# Patient Record
Sex: Male | Born: 1945 | Race: White | Hispanic: No | Marital: Single | Smoking: Former smoker
Health system: Southern US, Community
[De-identification: ages and names within clinical notes are randomized; demographics above are authoritative.]

## PROBLEM LIST (undated history)

## (undated) DIAGNOSIS — M62541 Muscle wasting and atrophy, not elsewhere classified, right hand: Secondary | ICD-10-CM

## (undated) DIAGNOSIS — I491 Atrial premature depolarization: Secondary | ICD-10-CM

## (undated) DIAGNOSIS — I10 Essential (primary) hypertension: Secondary | ICD-10-CM

## (undated) DIAGNOSIS — K219 Gastro-esophageal reflux disease without esophagitis: Secondary | ICD-10-CM

## (undated) DIAGNOSIS — M62542 Muscle wasting and atrophy, not elsewhere classified, left hand: Secondary | ICD-10-CM

## (undated) DIAGNOSIS — F32A Depression, unspecified: Secondary | ICD-10-CM

## (undated) DIAGNOSIS — M19042 Primary osteoarthritis, left hand: Secondary | ICD-10-CM

## (undated) DIAGNOSIS — F329 Major depressive disorder, single episode, unspecified: Secondary | ICD-10-CM

## (undated) DIAGNOSIS — F419 Anxiety disorder, unspecified: Secondary | ICD-10-CM

## (undated) DIAGNOSIS — M19041 Primary osteoarthritis, right hand: Secondary | ICD-10-CM

## (undated) DIAGNOSIS — A770 Spotted fever due to Rickettsia rickettsii: Secondary | ICD-10-CM

## (undated) DIAGNOSIS — Z8719 Personal history of other diseases of the digestive system: Secondary | ICD-10-CM

## (undated) DIAGNOSIS — Z8709 Personal history of other diseases of the respiratory system: Secondary | ICD-10-CM

## (undated) DIAGNOSIS — B999 Unspecified infectious disease: Secondary | ICD-10-CM

## (undated) DIAGNOSIS — I471 Supraventricular tachycardia: Secondary | ICD-10-CM

## (undated) DIAGNOSIS — F1010 Alcohol abuse, uncomplicated: Secondary | ICD-10-CM

## (undated) DIAGNOSIS — M5030 Other cervical disc degeneration, unspecified cervical region: Secondary | ICD-10-CM

## (undated) DIAGNOSIS — E785 Hyperlipidemia, unspecified: Secondary | ICD-10-CM

## (undated) DIAGNOSIS — IMO0001 Reserved for inherently not codable concepts without codable children: Secondary | ICD-10-CM

## (undated) DIAGNOSIS — Z889 Allergy status to unspecified drugs, medicaments and biological substances status: Secondary | ICD-10-CM

## (undated) DIAGNOSIS — J45909 Unspecified asthma, uncomplicated: Secondary | ICD-10-CM

## (undated) DIAGNOSIS — M1990 Unspecified osteoarthritis, unspecified site: Secondary | ICD-10-CM

## (undated) DIAGNOSIS — N400 Enlarged prostate without lower urinary tract symptoms: Secondary | ICD-10-CM

## (undated) HISTORY — DX: Allergy status to unspecified drugs, medicaments and biological substances: Z88.9

## (undated) HISTORY — DX: Muscle wasting and atrophy, not elsewhere classified, right hand: M62.541

## (undated) HISTORY — DX: Alcohol abuse, uncomplicated: F10.10

## (undated) HISTORY — DX: Other cervical disc degeneration, unspecified cervical region: M50.30

## (undated) HISTORY — DX: Primary osteoarthritis, left hand: M19.042

## (undated) HISTORY — DX: Depression, unspecified: F32.A

## (undated) HISTORY — DX: Essential (primary) hypertension: I10

## (undated) HISTORY — DX: Major depressive disorder, single episode, unspecified: F32.9

## (undated) HISTORY — DX: Personal history of other diseases of the digestive system: Z87.19

## (undated) HISTORY — PX: OTHER SURGICAL HISTORY: SHX169

## (undated) HISTORY — DX: Gastro-esophageal reflux disease without esophagitis: K21.9

## (undated) HISTORY — DX: Supraventricular tachycardia: I47.1

## (undated) HISTORY — DX: Personal history of other diseases of the respiratory system: Z87.09

## (undated) HISTORY — DX: Atrial premature depolarization: I49.1

## (undated) HISTORY — DX: Spotted fever due to Rickettsia rickettsii: A77.0

## (undated) HISTORY — DX: Muscle wasting and atrophy, not elsewhere classified, left hand: M62.542

## (undated) HISTORY — DX: Unspecified osteoarthritis, unspecified site: M19.90

## (undated) HISTORY — DX: Hyperlipidemia, unspecified: E78.5

## (undated) HISTORY — DX: Unspecified infectious disease: B99.9

## (undated) HISTORY — DX: Primary osteoarthritis, right hand: M19.041

---

## 1997-09-18 ENCOUNTER — Emergency Department (HOSPITAL_COMMUNITY): Admission: EM | Admit: 1997-09-18 | Discharge: 1997-09-18 | Payer: Self-pay

## 2000-03-04 HISTORY — PX: UMBILICAL HERNIA REPAIR: SHX196

## 2000-11-25 ENCOUNTER — Ambulatory Visit (HOSPITAL_COMMUNITY): Admission: RE | Admit: 2000-11-25 | Discharge: 2000-11-25 | Payer: Self-pay

## 2010-03-28 ENCOUNTER — Ambulatory Visit (HOSPITAL_COMMUNITY)
Admission: RE | Admit: 2010-03-28 | Discharge: 2010-03-28 | Payer: Self-pay | Source: Home / Self Care | Attending: Chiropractic Medicine | Admitting: Chiropractic Medicine

## 2010-05-24 ENCOUNTER — Other Ambulatory Visit: Payer: Self-pay | Admitting: Allergy

## 2010-05-24 ENCOUNTER — Ambulatory Visit
Admission: RE | Admit: 2010-05-24 | Discharge: 2010-05-24 | Disposition: A | Discharge status: Home / Self Care | Payer: Federal, State, Local not specified - PPO | Source: Ambulatory Visit | Attending: Allergy | Admitting: Allergy

## 2010-05-24 DIAGNOSIS — R059 Cough, unspecified: Secondary | ICD-10-CM

## 2010-05-24 DIAGNOSIS — R05 Cough: Secondary | ICD-10-CM

## 2011-12-18 ENCOUNTER — Encounter: Payer: Self-pay | Admitting: Family Medicine

## 2012-03-23 ENCOUNTER — Ambulatory Visit: Payer: Federal, State, Local not specified - PPO | Admitting: Family Medicine

## 2012-04-27 ENCOUNTER — Ambulatory Visit (INDEPENDENT_AMBULATORY_CARE_PROVIDER_SITE_OTHER): Payer: Federal, State, Local not specified - PPO | Admitting: Family Medicine

## 2012-04-27 ENCOUNTER — Encounter: Payer: Self-pay | Admitting: Family Medicine

## 2012-04-27 VITALS — BP 140/100 | HR 87 | Temp 98.4°F | Wt 171.0 lb

## 2012-04-27 DIAGNOSIS — I10 Essential (primary) hypertension: Secondary | ICD-10-CM | POA: Insufficient documentation

## 2012-04-27 MED ORDER — HYDROCHLOROTHIAZIDE 12.5 MG PO CAPS: 12.5000 mg | ORAL_CAPSULE | Freq: Every day | ORAL | Status: DC

## 2012-04-27 NOTE — Progress Notes (Signed)
  Subjective:    Ernest Sanders is a 67 y.o. male who presents for evaluation of elevated blood pressures. Age at onset of elevated blood pressure:  66. Cardiac symptoms: none. Patient denies: chest pain, chest pressure/discomfort, claudication, dyspnea, exertional chest pressure/discomfort, fatigue, irregular heart beat, lower extremity edema, near-syncope, orthopnea, palpitations, paroxysmal nocturnal dyspnea, syncope and tachypnea. Cardiovascular risk factors: advanced age (older than 49 for men, 77 for women). Use of agents associated with hypertension: none. History of target organ damage: none.  The following portions of the patient's history were reviewed and updated as appropriate: allergies, current medications, past family history, past medical history, past social history, past surgical history and problem list.  Review of Systems Pertinent items are noted in HPI.   Objective:    BP 140/100  Pulse 87  Temp(Src) 98.4 F (36.9 C) (Oral)  Wt 171 lb (77.565 kg)  SpO2 95% General appearance: alert, cooperative, appears stated age and no distress Lungs: clear to auscultation bilaterally Heart: S1, S2 normal Extremities: extremities normal, atraumatic, no cyanosis or edema  Cardiographics ECG: nonspecific ST changes   Assessment:    Hypertension, stage 1 . Evidence of target organ damage: none.    Plan:    Medication: begin hct 12.5 mg daily--- pt had but never took. Dietary sodium restriction. Regular aerobic exercise. Check blood pressures 2-3 times weekly and record. Follow up: 3 weeks and as needed.

## 2012-04-27 NOTE — Assessment & Plan Note (Signed)
Pt had hctz 12.5 mg from previous dr but never took it He will start and rto 2-3 weeks Check echo secondary to abn ekg

## 2012-04-27 NOTE — Patient Instructions (Addendum)

## 2012-05-05 ENCOUNTER — Ambulatory Visit (HOSPITAL_COMMUNITY): Payer: Federal, State, Local not specified - PPO | Attending: Cardiovascular Disease | Admitting: Radiology

## 2012-05-05 DIAGNOSIS — R9431 Abnormal electrocardiogram [ECG] [EKG]: Secondary | ICD-10-CM

## 2012-05-05 DIAGNOSIS — I10 Essential (primary) hypertension: Secondary | ICD-10-CM | POA: Insufficient documentation

## 2012-05-05 NOTE — Progress Notes (Signed)
Echocardiogram performed.  

## 2012-05-12 ENCOUNTER — Telehealth: Payer: Self-pay | Admitting: *Deleted

## 2012-05-12 NOTE — Telephone Encounter (Signed)
Left msg on [facility] triage returning call to [facility] concerning echo test.../lmb

## 2012-05-13 NOTE — Telephone Encounter (Signed)
Mild diastolic dysfunction--- bp control is important----he was supposed to have a f/u for bp check   Discussed with patient and he voiced understanding, Stopped HCTZ because he feels it is making him sick, but his BP has been really good. It has been crazy so he has not had time to make it back in. Patient said he did see the Echo and it was sent to Dr.Sharma for a tighten in bronchial tubes- Apt scheduled for tomorrow KP

## 2012-05-14 ENCOUNTER — Ambulatory Visit (INDEPENDENT_AMBULATORY_CARE_PROVIDER_SITE_OTHER): Payer: Federal, State, Local not specified - PPO | Admitting: Family Medicine

## 2012-05-14 ENCOUNTER — Encounter: Payer: Self-pay | Admitting: Family Medicine

## 2012-05-14 VITALS — BP 150/92 | HR 68 | Temp 98.2°F | Wt 171.2 lb

## 2012-05-14 DIAGNOSIS — I10 Essential (primary) hypertension: Secondary | ICD-10-CM

## 2012-05-14 LAB — BASIC METABOLIC PANEL
Calcium: 9.2 mg/dL (ref 8.4–10.5)
GFR: 74.41 mL/min (ref >60.00)
Potassium: 4.2 mEq/L (ref 3.5–5.1)
Sodium: 138 mEq/L (ref 135–145)

## 2012-05-14 MED ORDER — HYDROCHLOROTHIAZIDE 12.5 MG PO TABS: ORAL_TABLET | ORAL | Status: DC

## 2012-05-14 NOTE — Patient Instructions (Signed)

## 2012-05-14 NOTE — Progress Notes (Signed)
  Subjective:    Patient here for follow-up of elevated blood pressure.  He is exercising and is adherent to a low-salt diet.  Blood pressure is not well controlled at home. Cardiac symptoms: none. Patient denies: chest pain. Cardiovascular risk factors: advanced age (older than 36 for men, 78 for women), hypertension and male gender. Use of agents associated with hypertension: none. History of target organ damage: none.  The following portions of the patient's history were reviewed and updated as appropriate: allergies, current medications, past family history, past medical history, past social history, past surgical history and problem list.  Review of Systems Pertinent items are noted in HPI.     Objective:    BP 150/92  Pulse 68  Temp(Src) 98.2 F (36.8 C) (Oral)  Wt 171 lb 3.2 oz (77.656 kg)  SpO2 95% General appearance: alert, cooperative, appears stated age and no distress Throat: lips, mucosa, and tongue normal; teeth and gums normal Neck: no adenopathy, no carotid bruit, no JVD, supple, symmetrical, trachea midline and thyroid not enlarged, symmetric, no tenderness/mass/nodules Lungs: clear to auscultation bilaterally Extremities: extremities normal, atraumatic, no cyanosis or edema    Assessment:    Hypertension, stage 1 pt stopped meds on his own. . Evidence of target organ damage: none.    Plan:    Medication: resume hctz 12.5   1/2 tab po qd. Dietary sodium restriction. Regular aerobic exercise. Check blood pressures 2-3 times weekly and record. Follow up: 2 weeks and as needed.

## 2012-10-07 ENCOUNTER — Other Ambulatory Visit: Payer: Self-pay

## 2013-01-07 ENCOUNTER — Other Ambulatory Visit: Payer: Self-pay

## 2013-05-05 DIAGNOSIS — M2010 Hallux valgus (acquired), unspecified foot: Secondary | ICD-10-CM

## 2014-08-29 ENCOUNTER — Other Ambulatory Visit: Payer: Self-pay

## 2015-09-22 ENCOUNTER — Ambulatory Visit (INDEPENDENT_AMBULATORY_CARE_PROVIDER_SITE_OTHER): Payer: Federal, State, Local not specified - PPO | Admitting: Physician Assistant

## 2015-09-22 VITALS — BP 162/100 | HR 81 | Temp 98.7°F | Resp 16 | Ht 70.5 in | Wt 166.2 lb

## 2015-09-22 DIAGNOSIS — K439 Ventral hernia without obstruction or gangrene: Secondary | ICD-10-CM | POA: Diagnosis not present

## 2015-09-22 NOTE — Progress Notes (Signed)
[facility] [address] [place] [address] [phone]  Date:  09/22/2015   Name:  Ernest Sanders   DOB:  [birth year]   MRN:  [id number]  PCP:  Ann Held, DO   Chief Complaint  Patient presents with  . Referral    to [facility] (Dr. [name]) for a hernia repair     History of Present Illness:  Ernest Sanders is a 70 y.o. male patient who presents to [facility] for hernia repair.   Patient reports that he had a right sided hernia that was 3 years ago.  He has never reported it to a provider, however states that he has nodules.   10/2012 thought he had a bulge at his left abdomen.  By12/2014 started wearing a belt, and has worn since.  1 year later he had a bulge at his right lower abdomen.  This radiates into his leg.   He has never had this followed. Umbilical hernia repair in 2002 likely with [facility] on [address]   2 weeks ago, as he was shoveling gravel, and had pain of the left side around belly button area.   No blood in stool or black stool.  No normal heavy lifting.  No extensive weight loss.  No blood or black stool that he can note to me.  Wt Readings from Last 3 Encounters:  09/22/15 166 lb 3.2 oz (75.388 kg)  05/14/12 171 lb 3.2 oz (77.656 kg)  04/27/12 171 lb (77.565 kg)    Patient Active Problem List   Diagnosis Date Noted  . HTN (hypertension) 04/27/2012    Past Medical History  Diagnosis Date  . Alcohol abuse     Sober since 01/08/71  . Arthritis   . Depression   . GERD (gastroesophageal reflux disease)   . Multiple allergies   . HTN (hypertension)     Past Surgical History  Procedure Laterality Date  . Umbilical hernia repair  2002    Social History  Substance Use Topics  . Smoking status: Former Smoker    Types: Pipe, Landscape architect  . Smokeless tobacco: Never Used  . Alcohol Use: No     Comment: Recovering    Family History  Problem Relation Age of Onset  . Drug abuse Father   .  Heart disease Father     CHF  . Hypertension Mother   . Diabetes Mother     Surgical  . Arthritis    . Stroke Paternal Grandmother     No Known Allergies  Medication list has been reviewed and updated.  Current Outpatient Prescriptions on File Prior to Visit  Medication Sig Dispense Refill  . aspirin 81 MG tablet Take 81 mg by mouth daily.    . B Complex Vitamins (B COMPLEX 100 PO) Take 1 tablet by mouth daily.    . cetirizine (ZYRTEC) 10 MG tablet Take 10 mg by mouth daily.    . diazepam (VALIUM) 5 MG tablet Take 2.5 mg by mouth 2 (two) times daily.    . Multiple Vitamin (MULTIVITAMIN) tablet Take 1 tablet by mouth daily.    . saw palmetto 500 MG capsule Take 500 mg by mouth daily.    . vitamin E 400 UNIT capsule Take 400 Units by mouth daily.     No current facility-administered medications on file prior to visit.    ROS ROS otherwise unremarkable unless listed above.   Physical Examination: BP 146/90 mmHg  Pulse 81  Temp(Src) 98.7 F (37.1 C) (Oral)  Resp 16  Ht 5' 10.5" (1.791 m)  Wt 166 lb 3.2 oz (75.388 kg)  BMI 23.50 kg/m2  SpO2 96% Ideal Body Weight: Weight in (lb) to have BMI = 25: 176.4  Physical Exam  Constitutional: He is oriented to person, place, and time. He appears well-developed and well-nourished. No distress.  HENT:  Head: Normocephalic and atraumatic.  Eyes: Conjunctivae and EOM are normal. Pupils are equal, round, and reactive to light.  Cardiovascular: Normal rate and regular rhythm.  Exam reveals no gallop and no friction rub.   No murmur heard. Pulses:      Carotid pulses are 2+ on the right side, and 2+ on the left side.      Radial pulses are 2+ on the right side, and 2+ on the left side.       Dorsalis pedis pulses are 2+ on the right side, and 2+ on the left side.  Pulmonary/Chest: Effort normal. No respiratory distress.  Abdominal:  Fatty like prominent nodule at the central abdomen.   There is a prominent reducible nodule along the  left lower quadrant of abdomen. Hernia can not be detected upon palpation of inguinal.  Neurological: He is alert and oriented to person, place, and time.  Skin: Skin is warm and dry. He is not diaphoretic.  Psychiatric: He has a normal mood and affect. His behavior is normal.    Assessment and Plan: Ernest Sanders is a 70 y.o. male who is here today for referral to surgery. --though inguinal hernia, could not be palpated-- is colored to be a hernia by hx --he also has the prominent central abdominal area that should be evaluated as well as the left abdomen, so I believe a consult by gen surgery would be appreciated at this time.   --I have advised that he have a colonoscopy soon--he request dna stool.  I will look into whether we can provide this request, or should this be given by his pcp.    Hernia of abdominal wall - Plan: Ambulatory referral to General Surgery   Ivar Drape, PA-C [facility] 09/22/2015 11:22 AM

## 2015-09-22 NOTE — Patient Instructions (Addendum)
     IF you received an x-ray today, you will receive an invoice from [facility]. Please contact [facility] at [phone] with questions or concerns regarding your invoice.   IF you received labwork today, you will receive an invoice from [employer]. Please contact [facility] at [phone] with questions or concerns regarding your invoice.   Our billing staff will not be able to assist you with questions regarding bills from these companies.  You will be contacted with the lab results as soon as they are available. The fastest way to get your results is to activate your My Chart account. Instructions are located on the last page of this paperwork. If you have not heard from [place] regarding the results in 2 weeks, please contact this office.    Please await contact for surgical consult.

## 2015-10-01 ENCOUNTER — Telehealth: Payer: Self-pay | Admitting: Physician Assistant

## 2015-10-01 NOTE — Telephone Encounter (Signed)
Hey, how can I obtain  The "new" dna stool for colon screening?

## 2015-10-04 NOTE — Telephone Encounter (Signed)
Ernest Sanders, I'm sorry but I do not know. I will route this to Lab since it would probably more likely involve them to see if they know the process.  Lab, please advise Ernest Sanders if you know the answer to her question below. Thanks!

## 2015-10-05 NOTE — Telephone Encounter (Signed)
Do the cologuard.

## 2015-10-09 ENCOUNTER — Other Ambulatory Visit: Payer: Self-pay | Admitting: Physician Assistant

## 2015-10-09 DIAGNOSIS — Z1211 Encounter for screening for malignant neoplasm of colon: Secondary | ICD-10-CM

## 2015-10-09 DIAGNOSIS — Z0000 Encounter for general adult medical examination without abnormal findings: Secondary | ICD-10-CM

## 2015-10-09 NOTE — Progress Notes (Signed)
Order faxed to cologuard.  °

## 2015-10-09 NOTE — Progress Notes (Signed)
I would like to order this cologuard for the patient.

## 2015-10-10 ENCOUNTER — Ambulatory Visit (INDEPENDENT_AMBULATORY_CARE_PROVIDER_SITE_OTHER): Payer: Federal, State, Local not specified - PPO

## 2015-10-10 ENCOUNTER — Ambulatory Visit (INDEPENDENT_AMBULATORY_CARE_PROVIDER_SITE_OTHER): Payer: Federal, State, Local not specified - PPO | Admitting: Family Medicine

## 2015-10-10 DIAGNOSIS — I10 Essential (primary) hypertension: Secondary | ICD-10-CM

## 2015-10-10 DIAGNOSIS — M549 Dorsalgia, unspecified: Secondary | ICD-10-CM

## 2015-10-10 DIAGNOSIS — S61402A Unspecified open wound of left hand, initial encounter: Secondary | ICD-10-CM | POA: Diagnosis not present

## 2015-10-10 DIAGNOSIS — S29012A Strain of muscle and tendon of back wall of thorax, initial encounter: Secondary | ICD-10-CM | POA: Diagnosis not present

## 2015-10-10 DIAGNOSIS — M546 Pain in thoracic spine: Secondary | ICD-10-CM

## 2015-10-10 DIAGNOSIS — M542 Cervicalgia: Secondary | ICD-10-CM | POA: Diagnosis not present

## 2015-10-10 DIAGNOSIS — M5030 Other cervical disc degeneration, unspecified cervical region: Secondary | ICD-10-CM | POA: Diagnosis not present

## 2015-10-10 MED ORDER — CYCLOBENZAPRINE HCL 5 MG PO TABS: ORAL_TABLET | ORAL | 0 refills | Status: DC
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
## 2015-10-13 ENCOUNTER — Ambulatory Visit: Payer: Self-pay | Admitting: Surgery

## 2015-10-17 NOTE — Telephone Encounter (Signed)
I have received the cologuard paperwork back saying the phone number and group number are missing.  Please fill -- I do not see a phone number for him, and do not know how to proceed.  Also do not see a current roi to look at to fill.  Thanks Placed paperwork in nurse box

## 2015-10-19 NOTE — Telephone Encounter (Signed)
I have the form for cologuard, but do not have the pt's ph # to complete. I called [name] on his emerg contact list and LM asking her to [facility] with info.

## 2015-10-21 ENCOUNTER — Encounter: Payer: Self-pay | Admitting: Physician Assistant

## 2015-10-21 DIAGNOSIS — K4020 Bilateral inguinal hernia, without obstruction or gangrene, not specified as recurrent: Secondary | ICD-10-CM | POA: Insufficient documentation

## 2015-10-23 ENCOUNTER — Telehealth: Payer: Self-pay | Admitting: *Deleted

## 2015-10-23 NOTE — Telephone Encounter (Signed)
Mychart message sent for phone number

## 2015-11-14 ENCOUNTER — Encounter (HOSPITAL_COMMUNITY)
Admission: RE | Admit: 2015-11-14 | Discharge: 2015-11-14 | Disposition: A | Discharge status: Home / Self Care | Payer: Federal, State, Local not specified - PPO | Source: Ambulatory Visit | Attending: Surgery | Admitting: Surgery

## 2015-11-14 ENCOUNTER — Encounter (HOSPITAL_COMMUNITY): Payer: Self-pay

## 2015-11-14 DIAGNOSIS — I10 Essential (primary) hypertension: Secondary | ICD-10-CM | POA: Insufficient documentation

## 2015-11-14 DIAGNOSIS — Z01812 Encounter for preprocedural laboratory examination: Secondary | ICD-10-CM | POA: Insufficient documentation

## 2015-11-14 DIAGNOSIS — Z01810 Encounter for preprocedural cardiovascular examination: Secondary | ICD-10-CM | POA: Diagnosis present

## 2015-11-14 HISTORY — DX: Unspecified asthma, uncomplicated: J45.909

## 2015-11-14 HISTORY — DX: Benign prostatic hyperplasia without lower urinary tract symptoms: N40.0

## 2015-11-14 HISTORY — DX: Anxiety disorder, unspecified: F41.9

## 2015-11-14 HISTORY — DX: Reserved for inherently not codable concepts without codable children: IMO0001

## 2015-11-14 LAB — COMPREHENSIVE METABOLIC PANEL
ALT: 21 U/L (ref 17–63)
AST: 31 U/L (ref 15–41)
Albumin: 3.9 g/dL (ref 3.5–5.0)
Alkaline Phosphatase: 42 U/L (ref 38–126)
Anion gap: 6 (ref 5–15)
BILIRUBIN TOTAL: 0.6 mg/dL (ref 0.3–1.2)
BUN: 12 mg/dL (ref 6–20)
CALCIUM: 9.2 mg/dL (ref 8.9–10.3)
CO2: 24 mmol/L (ref 22–32)
CREATININE: 0.97 mg/dL (ref 0.61–1.24)
Chloride: 110 mmol/L (ref 101–111)
Glucose, Bld: 131 mg/dL — ABNORMAL HIGH (ref 65–99)
Potassium: 4.1 mmol/L (ref 3.5–5.1)
Sodium: 140 mmol/L (ref 135–145)
TOTAL PROTEIN: 7.1 g/dL (ref 6.5–8.1)

## 2015-11-14 LAB — CBC
HCT: 47.1 % (ref 39.0–52.0)
Hemoglobin: 15.9 g/dL (ref 13.0–17.0)
MCH: 32.3 pg (ref 26.0–34.0)
MCHC: 33.8 g/dL (ref 30.0–36.0)
MCV: 95.7 fL (ref 78.0–100.0)
PLATELETS: 195 10*3/uL (ref 150–400)
RBC: 4.92 MIL/uL (ref 4.22–5.81)
RDW: 12.7 % (ref 11.5–15.5)
WBC: 5 10*3/uL (ref 4.0–10.5)

## 2015-11-14 NOTE — Pre-Procedure Instructions (Signed)
Beacher Giglia Munos  11/14/2015      [facility] [place], [place] - [address] [place] [address] Phone: [phone] Fax: [phone]    Your procedure is scheduled on Sept 19  Report to [place] at 800 A.M.  Call this number if you have problems the morning of surgery:  [phone]   Remember:  Do not eat food or drink liquids after midnight.  Take these medicines the morning of surgery with A SIP OF WATER Dymista  Stop taking Asprin, BC's, Goody's, Herbal medications, Fish Oil, Vitamins, Aleve, Ibuprofen, Advil, Motrin   Do not wear jewelry, make-up or nail polish.  Do not wear lotions, powders, or perfumes, or deoderant.  Do not shave 48 hours prior to surgery.  Men may shave face and neck.  Do not bring valuables to the hospital.  [facility] is not responsible for any belongings or valuables.  Contacts, dentures or bridgework may not be worn into surgery.  Leave your suitcase in the car.  After surgery it may be brought to your room.  For patients admitted to the hospital, discharge time will be determined by your treatment team.  Patients discharged the day of surgery will not be allowed to drive home.    Special instructions:  [place] - Preparing for Surgery  Before surgery, you can play an important role.  Because skin is not sterile, your skin needs to be as free of germs as possible.  You can reduce the number of germs on you skin by washing with CHG (chlorahexidine gluconate) soap before surgery.  CHG is an antiseptic cleaner which kills germs and bonds with the skin to continue killing germs even after washing.  Please DO NOT use if you have an allergy to CHG or antibacterial soaps.  If your skin becomes reddened/irritated stop using the CHG and inform your nurse when you arrive at Short Stay.  Do not shave (including legs and underarms) for at least 48 hours  prior to the first CHG shower.  You may shave your face.  Please follow these instructions carefully:   1.  Shower with CHG Soap the night before surgery and the  morning of Surgery.  2.  If you choose to wash your hair, wash your hair first as usual with your  normal shampoo.  3.  After you shampoo, rinse your hair and body thoroughly to remove the  Shampoo.  4.  Use CHG as you would any other liquid soap.  You can apply chg directly   to the skin and wash gently with scrungie or a clean washcloth.  5.  Apply the CHG Soap to your body ONLY FROM THE NECK DOWN.    Do not use on open wounds or open sores.  Avoid contact with your eyes,   ears, mouth and genitals (private parts).  Wash genitals (private parts)       with your normal soap.  6.  Wash thoroughly, paying special attention to the area where your surgery  will be performed.  7.  Thoroughly rinse your body with warm water from the neck down.  8.  DO NOT shower/wash with your normal soap after using and rinsing off   the CHG Soap.  9.  Pat yourself dry with a clean towel.            10.  Wear clean pajamas.  11.  Place clean sheets on your bed the night of your first shower and do not sleep with pets.  Day of Surgery  Do not apply any lotions/deoderants the morning of surgery.  Please wear clean clothes to the hospital/surgery center.     Please read over the following fact sheets that you were given. Pain Booklet and Surgical Site Infection Prevention

## 2015-11-14 NOTE — Progress Notes (Addendum)
PCP is Dr. Garnet Koyanagi- States he has not been in a while. States he saw a Film/video editor for the echo but does not remember who. Echo noted in from 05-05-12 Denies having any recent chest pain. He does voice concerns about his enlarged prostate and not being able to void after his surgery. Encouraged him to speak with Dr Georgette Dover and the Anesthesiologist on the day of surgery, voices understanding. He states he didn't have a problem before when he had surgery, but he had a cousin that did.

## 2015-11-21 ENCOUNTER — Encounter (HOSPITAL_COMMUNITY): Payer: Self-pay | Admitting: *Deleted

## 2015-11-21 ENCOUNTER — Ambulatory Visit (HOSPITAL_COMMUNITY)
Admission: RE | Admit: 2015-11-21 | Discharge: 2015-11-21 | Disposition: A | Discharge status: Home / Self Care | Payer: Federal, State, Local not specified - PPO | Source: Ambulatory Visit | Attending: Surgery | Admitting: Surgery

## 2015-11-21 ENCOUNTER — Ambulatory Visit (HOSPITAL_COMMUNITY): Payer: Federal, State, Local not specified - PPO | Admitting: Certified Registered Nurse Anesthetist

## 2015-11-21 ENCOUNTER — Encounter (HOSPITAL_COMMUNITY)
Admission: RE | Disposition: A | Discharge status: Home / Self Care | Payer: Self-pay | Source: Ambulatory Visit | Attending: Surgery

## 2015-11-21 DIAGNOSIS — K219 Gastro-esophageal reflux disease without esophagitis: Secondary | ICD-10-CM | POA: Insufficient documentation

## 2015-11-21 DIAGNOSIS — M1990 Unspecified osteoarthritis, unspecified site: Secondary | ICD-10-CM | POA: Diagnosis not present

## 2015-11-21 DIAGNOSIS — D171 Benign lipomatous neoplasm of skin and subcutaneous tissue of trunk: Secondary | ICD-10-CM | POA: Insufficient documentation

## 2015-11-21 DIAGNOSIS — K4020 Bilateral inguinal hernia, without obstruction or gangrene, not specified as recurrent: Secondary | ICD-10-CM | POA: Diagnosis present

## 2015-11-21 DIAGNOSIS — F1010 Alcohol abuse, uncomplicated: Secondary | ICD-10-CM | POA: Diagnosis not present

## 2015-11-21 DIAGNOSIS — Z87891 Personal history of nicotine dependence: Secondary | ICD-10-CM | POA: Diagnosis not present

## 2015-11-21 HISTORY — PX: INGUINAL HERNIA REPAIR: SHX194

## 2015-11-21 HISTORY — PX: LIPOMA EXCISION: SHX5283

## 2015-11-21 HISTORY — PX: INSERTION OF MESH: SHX5868

## 2015-11-21 SURGERY — REPAIR, HERNIA, INGUINAL, BILATERAL, ADULT
Anesthesia: General | Site: Groin

## 2015-11-21 MED ORDER — 0.9 % SODIUM CHLORIDE (POUR BTL) OPTIME
TOPICAL | Status: DC | PRN
  Administered 2015-11-21: 1000 mL

## 2015-11-21 MED ORDER — ONDANSETRON HCL 4 MG/2ML IJ SOLN
INTRAMUSCULAR | Status: DC | PRN
  Administered 2015-11-21: 4 mg via INTRAVENOUS

## 2015-11-21 MED ORDER — HYDROCODONE-ACETAMINOPHEN 5-325 MG PO TABS
1.0000 | ORAL_TABLET | Freq: Four times a day (QID) | ORAL | 0 refills | Status: DC | PRN
Start: 2015-11-21 — End: 2016-12-12

## 2015-11-21 MED ORDER — ROCURONIUM BROMIDE 100 MG/10ML IV SOLN
INTRAVENOUS | Status: DC | PRN
  Administered 2015-11-21: 50 mg via INTRAVENOUS
  Administered 2015-11-21: 20 mg via INTRAVENOUS

## 2015-11-21 MED ORDER — ONDANSETRON HCL 4 MG/2ML IJ SOLN
INTRAMUSCULAR | Status: AC
  Filled 2015-11-21: qty 2

## 2015-11-21 MED ORDER — MIDAZOLAM HCL 2 MG/2ML IJ SOLN
2.0000 mg | Freq: Once | INTRAMUSCULAR | Status: AC
  Administered 2015-11-21: 2 mg via INTRAVENOUS

## 2015-11-21 MED ORDER — SUGAMMADEX SODIUM 200 MG/2ML IV SOLN
INTRAVENOUS | Status: DC | PRN
  Administered 2015-11-21: 150 mg via INTRAVENOUS

## 2015-11-21 MED ORDER — EPHEDRINE SULFATE 50 MG/ML IJ SOLN
INTRAMUSCULAR | Status: DC | PRN
  Administered 2015-11-21: 10 mg via INTRAVENOUS

## 2015-11-21 MED ORDER — LACTATED RINGERS IV SOLN
INTRAVENOUS | Status: DC
  Administered 2015-11-21 (×2): via INTRAVENOUS

## 2015-11-21 MED ORDER — MIDAZOLAM HCL 2 MG/2ML IJ SOLN: 0.5000 mg | Freq: Once | INTRAMUSCULAR | Status: DC | PRN

## 2015-11-21 MED ORDER — LIDOCAINE 2% (20 MG/ML) 5 ML SYRINGE
INTRAMUSCULAR | Status: AC
  Filled 2015-11-21: qty 5

## 2015-11-21 MED ORDER — PHENYLEPHRINE HCL 10 MG/ML IJ SOLN
INTRAVENOUS | Status: DC | PRN
  Administered 2015-11-21: 40 ug/min via INTRAVENOUS

## 2015-11-21 MED ORDER — FENTANYL CITRATE (PF) 100 MCG/2ML IJ SOLN
INTRAMUSCULAR | Status: AC
  Administered 2015-11-21: 100 ug via INTRAVENOUS
  Filled 2015-11-21: qty 2

## 2015-11-21 MED ORDER — PHENYLEPHRINE HCL 10 MG/ML IJ SOLN
INTRAMUSCULAR | Status: DC | PRN
  Administered 2015-11-21 (×2): 80 ug via INTRAVENOUS
  Administered 2015-11-21: 120 ug via INTRAVENOUS
  Administered 2015-11-21: 40 ug via INTRAVENOUS
  Administered 2015-11-21 (×3): 120 ug via INTRAVENOUS

## 2015-11-21 MED ORDER — PROPOFOL 10 MG/ML IV BOLUS
INTRAVENOUS | Status: AC
  Filled 2015-11-21: qty 20

## 2015-11-21 MED ORDER — PROPOFOL 10 MG/ML IV BOLUS
INTRAVENOUS | Status: DC | PRN
  Administered 2015-11-21: 150 mg via INTRAVENOUS

## 2015-11-21 MED ORDER — FENTANYL CITRATE (PF) 100 MCG/2ML IJ SOLN
INTRAMUSCULAR | Status: AC
  Filled 2015-11-21: qty 2

## 2015-11-21 MED ORDER — BUPIVACAINE-EPINEPHRINE (PF) 0.25% -1:200000 IJ SOLN
INTRAMUSCULAR | Status: AC
  Filled 2015-11-21: qty 60

## 2015-11-21 MED ORDER — PROMETHAZINE HCL 25 MG/ML IJ SOLN: 6.2500 mg | INTRAMUSCULAR | Status: DC | PRN

## 2015-11-21 MED ORDER — CHLORHEXIDINE GLUCONATE CLOTH 2 % EX PADS: 6.0000 | MEDICATED_PAD | Freq: Once | CUTANEOUS | Status: DC

## 2015-11-21 MED ORDER — FENTANYL CITRATE (PF) 100 MCG/2ML IJ SOLN
100.0000 ug | Freq: Once | INTRAMUSCULAR | Status: AC
  Administered 2015-11-21: 100 ug via INTRAVENOUS

## 2015-11-21 MED ORDER — BUPIVACAINE-EPINEPHRINE 0.25% -1:200000 IJ SOLN
INTRAMUSCULAR | Status: DC | PRN
  Administered 2015-11-21: 30 mL

## 2015-11-21 MED ORDER — CEFAZOLIN SODIUM-DEXTROSE 2-4 GM/100ML-% IV SOLN
2.0000 g | INTRAVENOUS | Status: AC
  Administered 2015-11-21: 2 g via INTRAVENOUS

## 2015-11-21 MED ORDER — FENTANYL CITRATE (PF) 100 MCG/2ML IJ SOLN
INTRAMUSCULAR | Status: DC | PRN
  Administered 2015-11-21: 100 ug via INTRAVENOUS

## 2015-11-21 MED ORDER — MIDAZOLAM HCL 2 MG/2ML IJ SOLN
INTRAMUSCULAR | Status: AC
  Administered 2015-11-21: 2 mg via INTRAVENOUS
  Filled 2015-11-21: qty 2

## 2015-11-21 MED ORDER — FENTANYL CITRATE (PF) 100 MCG/2ML IJ SOLN: 25.0000 ug | INTRAMUSCULAR | Status: DC | PRN

## 2015-11-21 MED ORDER — MEPERIDINE HCL 25 MG/ML IJ SOLN: 6.2500 mg | INTRAMUSCULAR | Status: DC | PRN

## 2015-11-21 MED ORDER — CEFAZOLIN SODIUM-DEXTROSE 2-4 GM/100ML-% IV SOLN
INTRAVENOUS | Status: AC
  Filled 2015-11-21: qty 100

## 2015-11-21 MED ORDER — BUPIVACAINE-EPINEPHRINE (PF) 0.25% -1:200000 IJ SOLN
INTRAMUSCULAR | Status: DC | PRN
  Administered 2015-11-21 (×2): 30 mL

## 2015-11-21 MED ORDER — SUGAMMADEX SODIUM 200 MG/2ML IV SOLN
INTRAVENOUS | Status: AC
  Filled 2015-11-21: qty 2

## 2015-11-21 MED ORDER — LIDOCAINE HCL (CARDIAC) 20 MG/ML IV SOLN
INTRAVENOUS | Status: DC | PRN
  Administered 2015-11-21: 20 mg via INTRAVENOUS

## 2015-11-21 SURGICAL SUPPLY — 60 items
APL SKNCLS STERI-STRIP NONHPOA (GAUZE/BANDAGES/DRESSINGS) ×2
BENZOIN TINCTURE PRP APPL 2/3 (GAUZE/BANDAGES/DRESSINGS) ×5 IMPLANT
BLADE SURG 10 STRL SS (BLADE) ×3 IMPLANT
BLADE SURG 15 STRL LF DISP TIS (BLADE) ×2 IMPLANT
BLADE SURG 15 STRL SS (BLADE) ×3
BLADE SURG ROTATE 9660 (MISCELLANEOUS) IMPLANT
CHLORAPREP W/TINT 26ML (MISCELLANEOUS) ×3 IMPLANT
COVER SURGICAL LIGHT HANDLE (MISCELLANEOUS) ×3 IMPLANT
DECANTER SPIKE VIAL GLASS SM (MISCELLANEOUS) ×3 IMPLANT
DRAIN PENROSE 1/2X12 LTX STRL (WOUND CARE) IMPLANT
DRAPE LAPAROSCOPIC ABDOMINAL (DRAPES) IMPLANT
DRAPE LAPAROTOMY T 98X78 PEDS (DRAPES) ×3 IMPLANT
DRAPE LAPAROTOMY TRNSV 102X78 (DRAPE) IMPLANT
DRAPE UTILITY XL STRL (DRAPES) ×6 IMPLANT
DRSG TEGADERM 2-3/8X2-3/4 SM (GAUZE/BANDAGES/DRESSINGS) ×1 IMPLANT
DRSG TEGADERM 4X4.75 (GAUZE/BANDAGES/DRESSINGS) ×5 IMPLANT
ELECT CAUTERY BLADE 6.4 (BLADE) ×3 IMPLANT
ELECT REM PT RETURN 9FT ADLT (ELECTROSURGICAL) ×3
ELECTRODE REM PT RTRN 9FT ADLT (ELECTROSURGICAL) ×2 IMPLANT
GAUZE SPONGE 2X2 8PLY STRL LF (GAUZE/BANDAGES/DRESSINGS) IMPLANT
GAUZE SPONGE 4X4 12PLY STRL (GAUZE/BANDAGES/DRESSINGS) ×3 IMPLANT
GAUZE SPONGE 4X4 16PLY XRAY LF (GAUZE/BANDAGES/DRESSINGS) ×3 IMPLANT
GLOVE BIO SURGEON STRL SZ7 (GLOVE) ×4 IMPLANT
GLOVE BIOGEL PI IND STRL 7.0 (GLOVE) IMPLANT
GLOVE BIOGEL PI IND STRL 7.5 (GLOVE) ×2 IMPLANT
GLOVE BIOGEL PI INDICATOR 7.0 (GLOVE) ×1
GLOVE BIOGEL PI INDICATOR 7.5 (GLOVE) ×1
GOWN STRL REUS W/ TWL LRG LVL3 (GOWN DISPOSABLE) ×4 IMPLANT
GOWN STRL REUS W/TWL LRG LVL3 (GOWN DISPOSABLE) ×6
KIT BASIN OR (CUSTOM PROCEDURE TRAY) ×3 IMPLANT
KIT ROOM TURNOVER OR (KITS) ×3 IMPLANT
MESH PARIETEX PROGRIP LEFT (Mesh General) ×1 IMPLANT
MESH PARIETEX PROGRIP RIGHT (Mesh General) ×1 IMPLANT
NDL HYPO 25GX1X1/2 BEV (NEEDLE) ×2 IMPLANT
NEEDLE HYPO 25GX1X1/2 BEV (NEEDLE) ×3 IMPLANT
NS IRRIG 1000ML POUR BTL (IV SOLUTION) ×3 IMPLANT
PACK SURGICAL SETUP 50X90 (CUSTOM PROCEDURE TRAY) ×3 IMPLANT
PAD ARMBOARD 7.5X6 YLW CONV (MISCELLANEOUS) ×3 IMPLANT
PENCIL BUTTON HOLSTER BLD 10FT (ELECTRODE) ×3 IMPLANT
SPECIMEN JAR SMALL (MISCELLANEOUS) ×3 IMPLANT
SPONGE GAUZE 2X2 STER 10/PKG (GAUZE/BANDAGES/DRESSINGS) ×1
SPONGE GAUZE 4X4 12PLY STER LF (GAUZE/BANDAGES/DRESSINGS) ×1 IMPLANT
SPONGE INTESTINAL PEANUT (DISPOSABLE) ×3 IMPLANT
SPONGE LAP 18X18 X RAY DECT (DISPOSABLE) ×3 IMPLANT
STRIP CLOSURE SKIN 1/2X4 (GAUZE/BANDAGES/DRESSINGS) ×5 IMPLANT
SUT MNCRL AB 4-0 PS2 18 (SUTURE) ×7 IMPLANT
SUT SILK 2 0 SH (SUTURE) IMPLANT
SUT SILK 3 0 (SUTURE)
SUT SILK 3-0 18XBRD TIE 12 (SUTURE) IMPLANT
SUT VIC AB 0 CT2 27 (SUTURE) ×6 IMPLANT
SUT VIC AB 2-0 SH 27 (SUTURE) ×6
SUT VIC AB 2-0 SH 27X BRD (SUTURE) IMPLANT
SUT VIC AB 2-0 SH 27XBRD (SUTURE) ×4 IMPLANT
SUT VIC AB 3-0 SH 27 (SUTURE) ×12
SUT VIC AB 3-0 SH 27X BRD (SUTURE) ×4 IMPLANT
SUT VIC AB 3-0 SH 27XBRD (SUTURE) ×2 IMPLANT
SYR BULB 3OZ (MISCELLANEOUS) ×3 IMPLANT
SYR CONTROL 10ML LL (SYRINGE) ×3 IMPLANT
TOWEL OR 17X24 6PK STRL BLUE (TOWEL DISPOSABLE) ×3 IMPLANT
TOWEL OR 17X26 10 PK STRL BLUE (TOWEL DISPOSABLE) ×3 IMPLANT

## 2015-11-21 NOTE — Anesthesia Procedure Notes (Signed)
Anesthesia Regional Block:  TAP block  Pre-Anesthetic Checklist: ,, timeout performed, Correct Patient, Correct Site, Correct Laterality, Correct Procedure, Correct Position, site marked, Risks and benefits discussed,  Surgical consent,  Pre-op evaluation,  At surgeon's request and post-op pain management  Laterality: Left  Prep: chloraprep       Needles:  Injection technique: Single-shot  Needle Type: Echogenic Needle     Needle Length: 9cm 9 cm Needle Gauge: 22 and 22 G    Additional Needles:  Procedures: ultrasound guided (picture in chart) TAP block Narrative:  Start time: 11/21/2015 10:43 AM End time: 11/21/2015 10:49 AM Injection made incrementally with aspirations every 5 mL.  Performed by: Personally  Anesthesiologist: Glennon Mac, Arin Peral  Additional Notes: Pt identified in Holding room.  Monitors applied. Working IV access confirmed. Sterile prep, drape L flank.  #22ga ECHOgenic needle into TAP with US guidance.  30cc 0.25% Bupivacaine with 1:200k epi injected incrementally after negative test dose, good layering of local.  Patient asymptomatic, VSS, no heme aspirated, tolerated well.

## 2015-11-21 NOTE — Op Note (Signed)
Open repair of bilateral inguinal hernias with mesh/ excision of multiple subcutaneous abdominal wall lipomas  Indications: The patient presented with a history of a bilateral, reducible inguinal hernia.  He also has several enlarging palpable subcutaneous masses on his abdominal wall that he needs to have excised.  Pre-operative Diagnosis: bilateral reducible inguinal hernia; multiple subcutaneous abdominal wall lipomas Post-operative Diagnosis: same  Surgeon: Maia Petties.   Assistants: none  Anesthesia: General endotracheal anesthesia and TAP block  ASA Class: 2  Procedure Details  The patient was seen again in the Holding Room. The risks, benefits, complications, treatment options, and expected outcomes were discussed with the patient. The possibilities of reaction to medication, pulmonary aspiration, perforation of viscus, bleeding, recurrent infection, the need for additional procedures, and development of a complication requiring transfusion or further operation were discussed with the patient and/or family. The likelihood of success in repairing the hernia and returning the patient to their previous functional status is good.  There was concurrence with the proposed plan, and informed consent was obtained. The site of surgery was properly noted/marked. The patient was taken to the Operating Room, identified as Ernest Sanders, and the procedure verified as bilateral inguinal hernia repair and lipomas excision. A Time Out was held and the above information confirmed.  The patient was placed in the supine position and underwent induction of anesthesia. The abdomen and groin were prepped with Chloraprep and draped in the standard fashion, and 0.25% Marcaine with epinephrine was used to anesthetize the skin over the mid-portion of the left inguinal canal. An oblique incision was made. Dissection was carried down through the subcutaneous tissue with cautery to the external oblique fascia.   We opened the external oblique fascia along the direction of its fibers to the external ring.  The spermatic cord was circumferentially dissected bluntly and retracted with a Penrose drain.    The floor of the inguinal canal was inspected and was intact.  We skeletonized the spermatic cord and reduced a very large left indirect inguinal hernia.  The internal ring was tightened with 0 Vicryl.  We used a left-sided Progrip mesh which was inserted and deployed across the floor of the inguinal canal. The mesh was tucked underneath the external oblique fascia laterally.  The flap of the mesh was closed around the spermatic cord to recreate the internal inguinal ring.  The mesh was secured to the pubic tubercle with 0 Vicryl.  The external oblique fascia was reapproximated with 2-0 Vicryl.  3-0 Vicryl was used to close the subcutaneous tissues and 4-0 Monocryl was used to close the skin in subcuticular fashion.   0.25% Marcaine with epinephrine was used to anesthetize the skin over the mid-portion of the right inguinal canal. An oblique incision was made. Dissection was carried down through the subcutaneous tissue with cautery to the external oblique fascia.  We opened the external oblique fascia along the direction of its fibers to the external ring.  The spermatic cord was circumferentially dissected bluntly and retracted with a Penrose drain.  The floor of the inguinal canal was inspected was intact but lax.  We skeletonized the spermatic cord and reduced a small indirect hernia .  We used a right-sided Progrip mesh which was inserted and deployed across the floor of the inguinal canal. The mesh was tucked underneath the external oblique fascia laterally.  The flap of the mesh was closed around the spermatic cord to recreate the internal inguinal ring.  The mesh was secured to the pubic  tubercle with 0 Vicryl.  The external oblique fascia was reapproximated with 2-0 Vicryl.  3-0 Vicryl was used to close the  subcutaneous tissues and 4-0 Monocryl was used to close the skin in subcuticular fashion.  We then made small incision directly over the three palpable subcutaneous masses.  One was just below the right costal margin in the mid-clavicular line.  The second mass was in the epigastrium.  The third mass was in the left anterior axillary line at the level of the umbilicus.  We bluntly dissected around each lipoma and removed these completely.  There were two lipomas in the epigastrium that were directly adjacent to each other.  Right costal margin - 3 cm; epigastric - 2 cm/ 2 cm; left side - 2 cm.  The incisions were closed with 4-0 Monocryl.  Benzoin and steri-strips were used to seal all of the incisions.  A clean dressing was applied.  The patient was then extubated and brought to the recovery room in stable condition.  All sponge, instrument, and needle counts were correct prior to closure and at the conclusion of the case.   Estimated Blood Loss: Minimal                 Complications: None; patient tolerated the procedure well.         Disposition: PACU - hemodynamically stable.         Condition: stable  Imogene Burn. Georgette Dover, MD, [facility]  General/ Trauma Surgery  11/21/2015 1:02 PM

## 2015-11-21 NOTE — Anesthesia Procedure Notes (Signed)
Anesthesia Regional Block:  TAP block  Pre-Anesthetic Checklist: ,, timeout performed, Correct Patient, Correct Site, Correct Laterality, Correct Procedure, Correct Position, site marked, Risks and benefits discussed, Surgical consent,  Pre-op evaluation,  At surgeon's request  Laterality: Right  Prep: chloraprep       Needles:   Needle Type: Echogenic Needle     Needle Length: 9cm 9 cm Needle Gauge: 22 and 22 G    Additional Needles:  Procedures: ultrasound guided (picture in chart) TAP block Narrative:  Start time: 11/21/2015 10:35 AM End time: 11/21/2015 10:41 AM Injection made incrementally with aspirations every 5 mL.  Performed by: Personally  Anesthesiologist: Glennon Mac, Meesha Sek  Additional Notes: Pt identified in Holding room.  Monitors applied. Working IV access confirmed. Sterile prep, drape R flank.  #22ga ECHOgenic needle into TAP with US guidance.  30cc 0.25% Bupivacaine with 1:200k epi injected incrementally after negative test dose, good layering of local.  Patient asymptomatic, VSS, no heme aspirated, tolerated well.

## 2015-11-21 NOTE — Anesthesia Preprocedure Evaluation (Addendum)
Anesthesia Evaluation  Patient identified by MRN, date of birth, ID band Patient awake    Reviewed: Allergy & Precautions, NPO status , Patient's Chart, lab work & pertinent test results  History of Anesthesia Complications Negative for: history of anesthetic complications  Airway Mallampati: I  TM Distance: >3 FB Neck ROM: Full    Dental  (+) Dental Advisory Given, Caps   Pulmonary former smoker,    breath sounds clear to auscultation       Cardiovascular hypertension (no meds ),  Rhythm:Regular Rate:Normal  '14 ECHO: EF 50-55%, mild MR   Neuro/Psych Anxiety Depression negative neurological ROS     GI/Hepatic GERD  Controlled,Remote h/o ETOH abuse   Endo/Other  negative endocrine ROS  Renal/GU negative Renal ROS     Musculoskeletal  (+) Arthritis ,   Abdominal   Peds  Hematology negative hematology ROS (+)   Anesthesia Other Findings   Reproductive/Obstetrics                            Anesthesia Physical Anesthesia Plan  ASA: II  Anesthesia Plan: General   Post-op Pain Management: GA combined w/ Regional for post-op pain   Induction: Intravenous  Airway Management Planned: Oral ETT  Additional Equipment:   Intra-op Plan:   Post-operative Plan: Extubation in OR  Informed Consent: I have reviewed the patients History and Physical, chart, labs and discussed the procedure including the risks, benefits and alternatives for the proposed anesthesia with the patient or authorized representative who has indicated his/her understanding and acceptance.   Dental advisory given  Plan Discussed with: CRNA and Surgeon  Anesthesia Plan Comments: (Plan routine monitors, GETA with bilateral TAP blocks for post op analgesia)        Anesthesia Quick Evaluation

## 2015-11-21 NOTE — H&P (Signed)
History of Present Illness Imogene Burn. Stokes Rattigan MD; 10/13/2015 11:27 AM) Patient words: hernia.  The patient is a 70 year old male who presents with an inguinal hernia. Referred by Dr. Garnet Koyanagi and Ivar Drape PA-C for bilateral inguinal hernias  This is a 70 year old male who presents with several year history of bilateral inguinal hernias. These have become fairly large and the patient has been wearing a truss for the last year. He has a lot of problems with seasonal allergies and often has very vigorous sneezes. This has been a problem since childhood. He had an umbilical hernia repaired 15 years ago reportedly with some mesh. We do not have those records available. He has some occasional twinges of pain just below his umbilicus but does not see any bulging in that area. The patient denies any GI obstructive symptoms. Incidentally, the patient also has had multiple lipomas removed from various parts of his body and still has several large lipomas. No imaging has been performed.    Other Problems  Alcohol Abuse Anxiety Disorder Arthritis Asthma Back Pain Depression Enlarged Prostate Gastroesophageal Reflux Disease Hemorrhoids High blood pressure Inguinal Hernia Umbilical Hernia Repair  Diagnostic Studies History  Colonoscopy never  Allergies No Known Drug Allergies  Medication History DiazePAM (5MG  Tablet, Oral) Active. ZyrTEC (10MG  Tablet, Oral) Active. Multiple Vitamin (Oral) Active. Fluticasone Propionate (50MCG/ACT Suspension, Nasal) Active. Medications Reconciled  Social History  Alcohol use Remotely quit alcohol use. Caffeine use Coffee, Tea. No drug use Tobacco use Former smoker.  Family History  Cancer Mother. Heart Disease Father. Hypertension Mother. Respiratory Condition Father.    Review of Systems General Not Present- Appetite Loss, Chills, Fatigue, Fever, Night Sweats, Weight Gain and Weight Loss. Skin Not  Present- Change in Wart/Mole, Dryness, Hives, Jaundice, New Lesions, Non-Healing Wounds, Rash and Ulcer. HEENT Present- Ringing in the Ears, Seasonal Allergies and Wears glasses/contact lenses. Not Present- Earache, Hearing Loss, Hoarseness, Nose Bleed, Oral Ulcers, Sinus Pain, Sore Throat, Visual Disturbances and Yellow Eyes. Respiratory Present- Wheezing. Not Present- Bloody sputum, Chronic Cough, Difficulty Breathing and Snoring. Breast Not Present- Breast Mass, Breast Pain, Nipple Discharge and Skin Changes. Cardiovascular Present- Leg Cramps. Not Present- Chest Pain, Difficulty Breathing Lying Down, Palpitations, Rapid Heart Rate, Shortness of Breath and Swelling of Extremities. Gastrointestinal Present- Bloating, Change in Bowel Habits, Excessive gas, Hemorrhoids, Indigestion and Nausea. Not Present- Abdominal Pain, Bloody Stool, Chronic diarrhea, Constipation, Difficulty Swallowing, Gets full quickly at meals, Rectal Pain and Vomiting. Male Genitourinary Present- Frequency, Nocturia and Urgency. Not Present- Blood in Urine, Change in Urinary Stream, Impotence, Painful Urination and Urine Leakage. Musculoskeletal Present- Back Pain, Joint Pain, Joint Stiffness, Muscle Pain and Swelling of Extremities. Not Present- Muscle Weakness. Neurological Not Present- Decreased Memory, Fainting, Headaches, Numbness, Seizures, Tingling, Tremor, Trouble walking and Weakness. Psychiatric Present- Anxiety, Change in Sleep Pattern and Depression. Not Present- Bipolar, Fearful and Frequent crying. Endocrine Not Present- Cold Intolerance, Excessive Hunger, Hair Changes, Heat Intolerance, Hot flashes and New Diabetes. Hematology Not Present- Blood Thinners, Easy Bruising, Excessive bleeding, Gland problems, HIV and Persistent Infections.  Vitals  Weight: 162.8 lb Height: 70.5in Body Surface Area: 1.92 m Body Mass Index: 23.03 kg/m  Temp.: 31F(Oral)  Pulse: 72 (Regular)  BP: 132/70 (Sitting, Left  Arm, Standard)       Physical Exam  The physical exam findings are as follows: Note:WDWN in NAD HEENT: EOMI, sclera anicteric Neck: No masses, no thyromegaly Lungs: CTA bilaterally; normal respiratory effort CV: Regular rate and rhythm; no murmurs Abd: +bowel  sounds, soft, non-tender, visible 4 cm subcutaneous mass in epigastrium; another 2 cm subcutaneous mass on right side; minimally tender Well-healed umbilical incision - no sign of umbilical hernia GU: bilateral descended testes; no testicular masses; large left inguinal hernia extending down into scrotum; visible right inguinal hernia Both hernias are reducible when supine Ext: Well-perfused; no edema Skin: Warm, dry; no sign of jaundice    Assessment & Plan BILATERAL INGUINAL HERNIA WITHOUT OBSTRUCTION OR GANGRENE (K40.20) 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 Schedule for Surgery - Open bilateral inguinal hernia repairs with mesh/ excision of two abdominal wall lipomas. The surgical procedure has been discussed with the patient. Potential risks, benefits, alternative treatments, and expected outcomes have been explained. All of the patient's questions at this time have been answered. The likelihood of reaching the patient's treatment goal is good. The patient understand the proposed surgical procedure and wishes to proceed.    Imogene Burn. Georgette Dover, MD, [facility]  General/ Trauma Surgery  11/21/2015 10:32 AM

## 2015-11-21 NOTE — Transfer of Care (Signed)
Immediate Anesthesia Transfer of Care Note  Patient: Ernest Sanders  Procedure(s) Performed: Procedure(s): OPEN BILATERAL INGUINAL HERNIA REPAIR (Bilateral) INSERTION OF MESH (Bilateral) EXCISION TWO ABDOMINAL WALL LIPOMAS (N/A)  Patient Location: PACU  Anesthesia Type:GA combined with regional for post-op pain  Level of Consciousness: awake, alert , oriented and patient cooperative  Airway & Oxygen Therapy: Patient Spontanous Breathing and Patient connected to nasal cannula oxygen  Post-op Assessment: Report given to RN and Post -op Vital signs reviewed and stable  Post vital signs: Reviewed and stable  Last Vitals:  Vitals:   11/21/15 1017 11/21/15 1304  BP:  (!) 127/95  Pulse: 81 93  Resp: 13 14  Temp:  36.8 C    Last Pain:  Vitals:   11/21/15 0913  TempSrc: Oral         Complications: No apparent anesthesia complications

## 2015-11-21 NOTE — Anesthesia Postprocedure Evaluation (Signed)
Anesthesia Post Note  Patient: Timofei Embery Bello  Procedure(s) Performed: Procedure(s) (LRB): OPEN BILATERAL INGUINAL HERNIA REPAIR (Bilateral) INSERTION OF MESH (Bilateral) EXCISION TWO ABDOMINAL WALL LIPOMAS (N/A)  Patient location during evaluation: PACU Anesthesia Type: General and Regional Level of consciousness: awake and alert, oriented and patient cooperative Pain management: pain level controlled Vital Signs Assessment: post-procedure vital signs reviewed and stable Respiratory status: spontaneous breathing, nonlabored ventilation and respiratory function stable Cardiovascular status: blood pressure returned to baseline and stable Postop Assessment: no signs of nausea or vomiting Anesthetic complications: no    Last Vitals:  Vitals:   11/21/15 1304 11/21/15 1320  BP: (!) 127/95 (!) 141/93  Pulse: 93 90  Resp: 14 (!) 21  Temp: 36.8 C     Last Pain:  Vitals:   11/21/15 0913  TempSrc: Oral                 Harvard Zeiss,E. Drenda Sobecki

## 2015-11-21 NOTE — Anesthesia Procedure Notes (Signed)
Procedure Name: Intubation Date/Time: 11/21/2015 11:05 AM Performed by: Salli Quarry Rhyan Radler Pre-anesthesia Checklist: Patient identified, Emergency Drugs available, Suction available and Patient being monitored Patient Re-evaluated:Patient Re-evaluated prior to inductionOxygen Delivery Method: Circle System Utilized Preoxygenation: Pre-oxygenation with 100% oxygen Intubation Type: IV induction Ventilation: Mask ventilation without difficulty Laryngoscope Size: Mac and 3 Grade View: Grade I Tube type: Oral Tube size: 7.5 mm Number of attempts: 1 Airway Equipment and Method: Stylet Placement Confirmation: ETT inserted through vocal cords under direct vision,  positive ETCO2 and breath sounds checked- equal and bilateral Secured at: 24 cm Tube secured with: Tape Dental Injury: Teeth and Oropharynx as per pre-operative assessment

## 2015-11-21 NOTE — Discharge Instructions (Signed)
[facility] _______[facility]  UMBILICAL OR INGUINAL HERNIA REPAIR: POST OP INSTRUCTIONS  Always review your discharge instruction sheet given to you by the facility where your surgery was performed. IF YOU HAVE DISABILITY OR FAMILY LEAVE FORMS, YOU MUST BRING THEM TO THE OFFICE FOR PROCESSING.   DO NOT GIVE THEM TO YOUR DOCTOR.  1. A  prescription for pain medication may be given to you upon discharge.  Take your pain medication as prescribed, if needed.  If narcotic pain medicine is not needed, then you may take acetaminophen (Tylenol) or ibuprofen (Advil) as needed. 2. Take your usually prescribed medications unless otherwise directed. 3. If you need a refill on your pain medication, please contact your pharmacy.  They will contact our office to request authorization. Prescriptions will not be filled after 5 pm or on week-ends. 4. You should follow a light diet the first 24 hours after arrival home, such as soup and crackers, etc.  Be sure to include lots of fluids daily.  Resume your normal diet the day after surgery. 5. Most patients will experience some swelling and bruising around the umbilicus or in the groin and scrotum.  Ice packs and reclining will help.  Swelling and bruising can take several days to resolve.  6. It is common to experience some constipation if taking pain medication after surgery.  Increasing fluid intake and taking a stool softener (such as Colace) will usually help or prevent this problem from occurring.  A mild laxative (Milk of Magnesia or Miralax) should be taken according to package directions if there are no bowel movements after 48 hours. 7. Unless discharge instructions indicate otherwise, you may remove your bandages 24-48 hours after surgery, and you may shower at that time.  You may have steri-strips (small skin tapes) in place directly over the incision.  These strips should be left on the skin for 7-10 days.  If your surgeon used skin glue on the  incision, you may shower in 24 hours.  The glue will flake off over the next 2-3 weeks.  Any sutures or staples will be removed at the office during your follow-up visit. 8. ACTIVITIES:  You may resume regular (light) daily activities beginning the next day--such as daily self-care, walking, climbing stairs--gradually increasing activities as tolerated.  You may have sexual intercourse when it is comfortable.  Refrain from any heavy lifting or straining until approved by your doctor. a. You may drive when you are no longer taking prescription pain medication, you can comfortably wear a seatbelt, and you can safely maneuver your car and apply brakes. b. RETURN TO WORK:  __________________________________________________________ 9. You should see your doctor in the office for a follow-up appointment approximately 2-3 weeks after your surgery.  Make sure that you call for this appointment within a day or two after you arrive home to insure a convenient appointment time. 10. OTHER INSTRUCTIONS:  __________________________________________________________________________________________________________________________________________________________________________________________  WHEN TO CALL YOUR DOCTOR: 1. Fever over 101.0 2. Inability to urinate 3. Nausea and/or vomiting 4. Extreme swelling or bruising 5. Continued bleeding from incision. 6. Increased pain, redness, or drainage from the incision  The clinic staff is available to answer your questions during regular business hours.  Please don't hesitate to call and ask to speak to one of the nurses for clinical concerns.  If you have a medical emergency, go to the nearest emergency room or call 911.  A surgeon from [facility] is always on call at the hospital     [address], [address], [place], [place]  [address] ?  [address] [address], [place], [place]   [address] [phone] ? [phone] ? FAX [phone] Web site:  [email or web address]  

## 2015-11-22 ENCOUNTER — Encounter (HOSPITAL_COMMUNITY): Payer: Self-pay | Admitting: Surgery

## 2015-12-29 ENCOUNTER — Encounter: Payer: Self-pay | Admitting: Physician Assistant

## 2016-01-05 LAB — COLOGUARD
COLOGUARD: NEGATIVE
Cologuard: NEGATIVE

## 2016-10-10 ENCOUNTER — Encounter: Payer: Self-pay | Admitting: Medical

## 2016-10-10 ENCOUNTER — Telehealth: Payer: Self-pay | Admitting: Medical

## 2016-10-10 ENCOUNTER — Ambulatory Visit (INDEPENDENT_AMBULATORY_CARE_PROVIDER_SITE_OTHER): Payer: Federal, State, Local not specified - PPO | Admitting: Medical

## 2016-10-10 VITALS — BP 160/90 | HR 65 | Temp 98.1°F | Resp 14 | Ht 72.0 in | Wt 168.9 lb

## 2016-10-10 DIAGNOSIS — R5383 Other fatigue: Secondary | ICD-10-CM

## 2016-10-10 DIAGNOSIS — R030 Elevated blood-pressure reading, without diagnosis of hypertension: Secondary | ICD-10-CM

## 2016-10-10 DIAGNOSIS — W57XXXA Bitten or stung by nonvenomous insect and other nonvenomous arthropods, initial encounter: Secondary | ICD-10-CM

## 2016-10-10 NOTE — Patient Instructions (Addendum)
For your fatigue will get cbc, cmp, tsh, b12, and vitamin d level.  For tick bites will get tick bite studies.  For elevated blood pressure I want you to bring your bp machine in and compare to our readings. Your at home readings are dramatically lower and we need to confirm at home readings area accurate before conclusion of white coat. Also need to see if we need to give you low dose med.  Follow up next week nurse visit bp check. Follow up with myself or Dr. Etter Sjogren will depend on nurse bp check values and lab results.

## 2016-10-10 NOTE — Progress Notes (Signed)
Subjective:    Patient ID: Ernest Sanders, male    DOB: 1945-12-29, 71 y.o.   MRN: [id number]  HPI  Pt in for first time.   Pt last seen in 2014. Hx of htn in past. In past was on low dose diuretic. Pt indicates he used med when he needed.   Pt in past had checked his blood pressure for years at home. He states he often checks his blood pressure regularly. Usually he states his bp is undercontrol. He states with regular dose diuretic(full dose) his k dropped.  He states at home bp readings are sually 120/80 without med. Sometimes bp can drop to 107/53.  Former provider years ago speculated he might have white coat hypertension. He states trend of higher bp in winter and lower in summers.    Pt had 5-6 tick bites over past 2.5 months. He had some more joint pain than his typical joint pain.  Last bite was 7 weeks ago.   Pt states his allergist may do meat allergy testing later.  Pt on valium 2.5 mg in am. Librium 10 mg at night. Pt sees psychiatrist.  Has allergies uses flonase and xyzal.  Some mild-moderate fatigue. On review. No recent labs. No hx of anemia or low thyroid.   Review of Systems  Constitutional: Positive for fatigue. Negative for chills and fever.  HENT: Negative for congestion, ear pain, mouth sores and postnasal drip.   Respiratory: Negative for cough, chest tightness, shortness of breath and wheezing.   Cardiovascular: Negative for chest pain and palpitations.  Gastrointestinal: Negative for abdominal pain, blood in stool, constipation, diarrhea, nausea and vomiting.  Musculoskeletal: Positive for arthralgias. Negative for back pain, myalgias and neck pain.       Mild worse over past months.  Skin: Negative for rash.  Hematological: Negative for adenopathy. Does not bruise/bleed easily.  Psychiatric/Behavioral: Negative for behavioral problems, confusion, self-injury and suicidal ideas. The patient is not nervous/anxious and is not hyperactive.    Past  Medical History:  Diagnosis Date  . 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 Anxiety   . Arthritis   . Asthma    slight  . Depression   . Enlarged prostate   . GERD (gastroesophageal reflux disease)   . HTN (hypertension)   . Multiple allergies   . Shortness of breath dyspnea      Social History   Social History  . Marital status: Single    Spouse name: N/A  . Number of children: N/A  . Years of education: N/A   Occupational History  . Not on file.   Social History Main Topics  . Smoking status: Former Smoker    Types: Pipe, Landscape architect  . Smokeless tobacco: Never Used  . Alcohol use No     Comment: Recovering  . Drug use: No  . Sexual activity: Not on file   Other Topics Concern  . Not on file   Social History Narrative  . No narrative on file    Past Surgical History:  Procedure Laterality Date  . INGUINAL HERNIA REPAIR Bilateral 11/21/2015   Procedure: OPEN BILATERAL INGUINAL HERNIA REPAIR;  Surgeon: [name], MD;  Location: [facility];  Service: General;  Laterality: Bilateral;  . INSERTION OF MESH Bilateral 11/21/2015   Procedure: INSERTION OF MESH;  Surgeon: [name], MD;  Location: [facility];  Service: General;  Laterality: Bilateral;  . LIPOMA EXCISION N/A 11/21/2015   Procedure: EXCISION TWO ABDOMINAL WALL  LIPOMAS;  Surgeon: [name], MD;  Location: [facility];  Service: General;  Laterality: N/A;  . UMBILICAL HERNIA REPAIR  2002    Family History  Problem Relation Age of Onset  . Drug abuse Father   . Heart disease Father        CHF  . Hypertension Mother   . Diabetes Mother        Surgical  . Arthritis Unknown   . Stroke Paternal Grandmother     Allergies  Allergen Reactions  . 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 Lactose Intolerance (Gi) Diarrhea    Bloating,gas    Current Outpatient Prescriptions on File Prior to Visit  Medication Sig Dispense Refill  . Azelastine-Fluticasone (DYMISTA) 137-50 MCG/ACT SUSP Place 1 spray into the  nose daily.    . B Complex Vitamins (B COMPLEX 100 PO) Take 1 tablet by mouth daily.    . Collagen 500 MG CAPS Take 2 capsules by mouth daily.    . diazepam (VALIUM) 5 MG tablet Take 2.5 mg by mouth at bedtime.     Marland Kitchen EPINEPHrine 0.3 mg/0.3 mL IJ SOAJ injection Inject 0.3 mLs into the muscle as needed (allergic reaction).   1  . HYDROcodone-acetaminophen (NORCO/VICODIN) 5-325 MG tablet Take 1-2 tablets by mouth every 6 (six) hours as needed for moderate pain. 30 tablet 0  . levocetirizine (XYZAL) 5 MG tablet Take 5 mg by mouth every evening.    . Multiple Vitamin (MULTIVITAMIN) tablet Take 1 tablet by mouth daily.    . TURMERIC PO Take 1 tablet by mouth daily.     No current facility-administered medications on file prior to visit.     BP (!) 170/91   Pulse 65   Temp 98.1 F (36.7 C) (Oral)   Resp 14   Ht 6' (1.829 m)   Wt 168 lb 14.4 oz (76.6 kg)   SpO2 94%   BMI 22.91 kg/m       Objective:   Physical Exam  General Mental Status- Alert. General Appearance- Not in acute distress.   Skin General: Color- Normal Color. Moisture- Normal Moisture.  Neck Carotid Arteries- Normal color. Moisture- Normal Moisture. No carotid bruits. No JVD.  Chest and Lung Exam Auscultation: Breath Sounds:-Normal.  Cardiovascular Auscultation:Rythm- Regular. Murmurs & Other Heart Sounds:Auscultation of the heart reveals- No Murmurs.  Abdomen Inspection:-Inspeection Normal. Palpation/Percussion:Note:No mass. Palpation and Percussion of the abdomen reveal- Non Tender, Non Distended + BS, no rebound or guarding.  Neurologic Cranial Nerve exam:- CN III-XII intact(No nystagmus), symmetric smile. Strength:- 5/5 equal and symmetric strength both upper and lower extremities.      Assessment & Plan:  For your fatigue will get cbc, cmp, tsh, b12, and vitamin d level.  For tick bites will get tick bite studies.  For elevated blood pressure I want you to bring your bp machine in and compare to  our readings. Your at home readings are dramatically lower and we need to confirm at home readings area accurate before conclusion of white coat. Also need to see if we need to give you low dose med.  Follow up next week nurse visit bp check. Follow up with myself or Dr. Etter Sjogren will depend on nurse bp check values and lab results.  Will also send note to staff informing that appointment with Dr. Etter Sjogren would be needed in near future as he states he scheduled with me with understanding Dr. Etter Sjogren would be his pcp. Ernest Sanders, Ernest Miller, PA-C

## 2016-10-10 NOTE — Telephone Encounter (Signed)
Pt seen by me today. But he states he was establishing with me and then will see Dr. Etter Sjogren. Usually when that is the case pcp in epic lists who pt wants. Can you go ahead and switch pcp to Dr.Lowne and can you get pt appointment with Dr. Etter Sjogren. Otherwise he will likely continue to see me and he already expressed his preference for Dr. Etter Sjogren. Let me know I problem getting him in with Dr. Etter Sjogren.

## 2016-10-11 ENCOUNTER — Telehealth: Payer: Self-pay | Admitting: Medical

## 2016-10-11 LAB — CBC WITH DIFFERENTIAL/PLATELET
Basophils Absolute: 0.1 10*3/uL (ref 0.0–0.1)
Basophils Relative: 1.1 % (ref 0.0–3.0)
Eosinophils Absolute: 0.1 10*3/uL (ref 0.0–0.7)
Eosinophils Relative: 1.7 % (ref 0.0–5.0)
HEMATOCRIT: 47.6 % (ref 39.0–52.0)
HEMOGLOBIN: 15.8 g/dL (ref 13.0–17.0)
LYMPHS PCT: 26.1 % (ref 12.0–46.0)
Lymphs Abs: 1.8 10*3/uL (ref 0.7–4.0)
MCHC: 33.1 g/dL (ref 30.0–36.0)
MCV: 98.1 fl (ref 78.0–100.0)
MONOS PCT: 8.7 % (ref 3.0–12.0)
Monocytes Absolute: 0.6 10*3/uL (ref 0.1–1.0)
Neutro Abs: 4.4 10*3/uL (ref 1.4–7.7)
Neutrophils Relative %: 62.4 % (ref 43.0–77.0)
Platelets: 233 10*3/uL (ref 150.0–400.0)
RBC: 4.85 Mil/uL (ref 4.22–5.81)
RDW: 13.1 % (ref 11.5–15.5)
WBC: 7 10*3/uL (ref 4.0–10.5)

## 2016-10-11 LAB — COMPREHENSIVE METABOLIC PANEL
ALBUMIN: 4.4 g/dL (ref 3.5–5.2)
ALK PHOS: 53 U/L (ref 39–117)
ALT: 16 U/L (ref 0–53)
AST: 26 U/L (ref 0–37)
BUN: 16 mg/dL (ref 6–23)
CALCIUM: 10.2 mg/dL (ref 8.4–10.5)
CO2: 31 mEq/L (ref 19–32)
Chloride: 106 mEq/L (ref 96–112)
Creatinine, Ser: 1.1 mg/dL (ref 0.40–1.50)
GFR: 70.36 mL/min (ref >60.00)
Glucose, Bld: 98 mg/dL (ref 70–99)
POTASSIUM: 4.9 meq/L (ref 3.5–5.1)
SODIUM: 141 meq/L (ref 135–145)
TOTAL PROTEIN: 7.6 g/dL (ref 6.0–8.3)
Total Bilirubin: 0.5 mg/dL (ref 0.2–1.2)

## 2016-10-11 LAB — TSH: TSH: 1.19 u[IU]/mL (ref 0.35–4.50)

## 2016-10-11 LAB — VITAMIN B12: Vitamin B-12: 359 pg/mL (ref 200–1100)

## 2016-10-11 LAB — VITAMIN D 25 HYDROXY (VIT D DEFICIENCY, FRACTURES): VITD: 51.1 ng/mL (ref 30.00–100.00)

## 2016-10-11 LAB — LYME AB/WESTERN BLOT REFLEX: B burgdorferi Ab IgG+IgM: <0.9 Index (ref <0.90)

## 2016-10-11 LAB — ROCKY MTN SPOTTED FVR ABS PNL(IGG+IGM)
RMSF IgG: DETECTED — AB
RMSF IgM: NOT DETECTED

## 2016-10-11 LAB — REFLEX RMSF IGG TITER: RMSF IgG Titer: 1:64 {titer} — ABNORMAL HIGH

## 2016-10-11 MED ORDER — DOXYCYCLINE HYCLATE 100 MG PO TABS: 100.0000 mg | ORAL_TABLET | Freq: Two times a day (BID) | ORAL | 0 refills | Status: DC

## 2016-10-11 NOTE — Telephone Encounter (Signed)
Dr Etter Sjogren would have to approve him first. I will route this to her to see if she will accept him since she is not currently accepting new patients

## 2016-10-11 NOTE — Telephone Encounter (Signed)
Prescription of doxycycline sent to pharmacy.

## 2016-10-14 ENCOUNTER — Telehealth: Payer: Self-pay | Admitting: Medical

## 2016-10-14 NOTE — Telephone Encounter (Signed)
He was my pt-- I just had not seen him in a few years

## 2016-10-14 NOTE — Telephone Encounter (Signed)
Patient calling to get results Call back number [phone]

## 2016-10-14 NOTE — Telephone Encounter (Signed)
Pt states he was made aware of results, then someone else left him a message

## 2016-10-14 NOTE — Telephone Encounter (Signed)
Patient scheduled with Dr. [name] 11/08/2016

## 2016-10-14 NOTE — Telephone Encounter (Signed)
Relation to QJ:FHLK Call back number:[phone]  Reason for call:  Patient inquiring about lab results,please advise best # [phone] F

## 2016-10-14 NOTE — Telephone Encounter (Signed)
Noted will get patient scheduled will Dr Ernest Sanders

## 2016-10-16 ENCOUNTER — Ambulatory Visit (INDEPENDENT_AMBULATORY_CARE_PROVIDER_SITE_OTHER): Payer: Federal, State, Local not specified - PPO | Admitting: Medical

## 2016-10-16 VITALS — BP 134/82 | HR 70

## 2016-10-16 DIAGNOSIS — R030 Elevated blood-pressure reading, without diagnosis of hypertension: Secondary | ICD-10-CM

## 2016-10-16 NOTE — Patient Instructions (Addendum)
Per [name], PA-C: Call the office to establish care with Dr. [name] as discussed in last office visit. Start Doxycycline 100 MG twice daily for tick bites & follow-up with PCP in 10-14 days.

## 2016-10-16 NOTE — Progress Notes (Signed)
Pre visit review using our clinic review tool, if applicable. No additional management support is needed unless otherwise documented below in the visit note.  Patient presents in office for blood pressure check per OV note 10/10/16. Currently, the patient does not take medication for blood pressure. Patient is asymptomatic. RN obtained the following readings during today's visit: BP 135/78 P 72 & 02 95%; also BP 134/82 & P 70 with the patient's home cuff.   Per [name], PA-C: Call the office to establish care with Dr. [name] as discussed in last office visit. Start Doxycycline 100 MG twice daily for tick bites & follow-up with PCP in 10-14 days.  Patient was made aware of the provider's instructions & voiced understanding. Next appointment 11/08/16 at 10:30 AM.  No bp med needed based on today readings and his at home readings appear accurate as his machine checked against our reading.  Saguier, Percell Miller, PA-C

## 2016-10-29 ENCOUNTER — Telehealth: Payer: Self-pay | Admitting: Medical

## 2016-10-29 NOTE — Telephone Encounter (Signed)
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 Etter Sjogren on 11/08/16 however would like to know if he should have labs done to ensure that he is cleared up prior to that appt, also would like to know if he can get tested for  alphagal allergy.

## 2016-10-29 NOTE — Telephone Encounter (Signed)
error 

## 2016-10-30 ENCOUNTER — Telehealth: Payer: Self-pay | Admitting: Medical

## 2016-10-30 NOTE — Telephone Encounter (Signed)
Typically I don't repeat igg or titer studies post rmsf treatment as they can remain elevated for months after treatment. It is more important how he feels clinically.  Regarding alpha gal allergy test not something I routinely do this testing unless symptomatic. Is he getting rashes/itching  hours after eating meats. Or any new GI symptoms.   In epic alpha gal allergy test can be done. Or he can wait and discuss with Dr. Etter Sjogren.

## 2016-10-30 NOTE — Telephone Encounter (Signed)
Opened to review 

## 2016-10-31 NOTE — Telephone Encounter (Signed)
LMOVM advising that typically does not repeat testing after Tx of RMSF/asked that if he has any Sx of rash or itching hours after eating meals then to please call back otherwise he can address any issues at visit with Dr. Osie Cheeks dmf

## 2016-11-08 ENCOUNTER — Encounter: Payer: Self-pay | Admitting: Family Medicine

## 2016-11-08 ENCOUNTER — Ambulatory Visit (INDEPENDENT_AMBULATORY_CARE_PROVIDER_SITE_OTHER): Payer: Federal, State, Local not specified - PPO | Admitting: Family Medicine

## 2016-11-08 DIAGNOSIS — A770 Spotted fever due to Rickettsia rickettsii: Secondary | ICD-10-CM | POA: Diagnosis not present

## 2016-11-08 DIAGNOSIS — I10 Essential (primary) hypertension: Secondary | ICD-10-CM | POA: Diagnosis not present

## 2016-11-08 HISTORY — DX: Spotted fever due to Rickettsia rickettsii: A77.0

## 2016-11-08 NOTE — Assessment & Plan Note (Signed)
Finished abx 

## 2016-11-08 NOTE — Progress Notes (Signed)
Patient ID: Ernest Sanders, male    DOB: 1945-06-04  Age: 71 y.o. MRN: [id number]    Subjective:  Subjective  HPI Ernest Sanders presents for f/u RMSF and bp.  He has finished the doxycycline.  He has been fatigued     Overall he is doing well-- he had his hearing checked on Wed.    Review of Systems  Constitutional: Negative for appetite change, diaphoresis, fatigue and unexpected weight change.  Eyes: Negative for pain, redness and visual disturbance.  Respiratory: Negative for cough, chest tightness, shortness of breath and wheezing.   Cardiovascular: Negative for chest pain, palpitations and leg swelling.  Endocrine: Negative for cold intolerance, heat intolerance, polydipsia, polyphagia and polyuria.  Genitourinary: Negative for difficulty urinating, dysuria and frequency.  Neurological: Negative for dizziness, light-headedness, numbness and headaches.    History Past Medical History:  Diagnosis Date  . 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 Anxiety   . Arthritis   . Asthma    slight  . Depression   . Enlarged prostate   . GERD (gastroesophageal reflux disease)   . HTN (hypertension)   . Multiple allergies   . Shortness of breath dyspnea     He has a past surgical history that includes Umbilical hernia repair (2002); Inguinal hernia repair (Bilateral, 11/21/2015); Insertion of mesh (Bilateral, 11/21/2015); and Lipoma excision (N/A, 11/21/2015).   His family history includes Arthritis in his unknown relative; Diabetes in his mother; Drug abuse in his father; Heart disease in his father; Hypertension in his mother; Stroke in his paternal grandmother.He reports that he has quit smoking. His smoking use included Pipe and Cigars. He has never used smokeless tobacco. He reports that he does not drink alcohol or use drugs.  Current Outpatient Prescriptions on File Prior to Visit  Medication Sig Dispense Refill  . Azelastine-Fluticasone (DYMISTA) 137-50 MCG/ACT SUSP Place 1  spray into the nose daily.    . B Complex Vitamins (B COMPLEX 100 PO) Take 1 tablet by mouth daily.    . Collagen 500 MG CAPS Take 2 capsules by mouth daily.    . diazepam (VALIUM) 5 MG tablet Take 2.5 mg by mouth at bedtime.     Marland Kitchen EPINEPHrine 0.3 mg/0.3 mL IJ SOAJ injection Inject 0.3 mLs into the muscle as needed (allergic reaction).   1  . HYDROcodone-acetaminophen (NORCO/VICODIN) 5-325 MG tablet Take 1-2 tablets by mouth every 6 (six) hours as needed for moderate pain. 30 tablet 0  . levocetirizine (XYZAL) 5 MG tablet Take 5 mg by mouth every evening.    . Multiple Vitamin (MULTIVITAMIN) tablet Take 1 tablet by mouth daily.    . TURMERIC PO Take 1 tablet by mouth daily.     No current facility-administered medications on file prior to visit.      Objective:  Objective  Physical Exam  Constitutional: He is oriented to person, place, and time. Vital signs are normal. He appears well-developed and well-nourished. He is sleeping.  HENT:  Head: Normocephalic and atraumatic.  Mouth/Throat: Oropharynx is clear and moist.  Eyes: Pupils are equal, round, and reactive to light. EOM are normal.  Neck: Normal range of motion. Neck supple. No thyromegaly present.  Cardiovascular: Normal rate and regular rhythm.   No murmur heard. Pulmonary/Chest: Effort normal and breath sounds normal. No respiratory distress. He has no wheezes. He has no rales. He exhibits no tenderness.  Musculoskeletal: He exhibits no edema or tenderness.  Neurological: He is alert and  oriented to person, place, and time.  Skin: Skin is warm and dry.  Psychiatric: He has a normal mood and affect. His behavior is normal. Judgment and thought content normal.  Nursing note and vitals reviewed.  BP 128/84 (BP Location: Left Arm, Patient Position: Sitting, Cuff Size: Normal)   Pulse 81   Temp 98.6 F (37 C) (Oral)   Ht 5\' 9"  (1.753 m)   Wt 165 lb 6.4 oz (75 kg)   SpO2 98%   BMI 24.43 kg/m  Wt Readings from Last 3  Encounters:  11/08/16 165 lb 6.4 oz (75 kg)  10/10/16 168 lb 14.4 oz (76.6 kg)  11/21/15 164 lb 12.8 oz (74.8 kg)     Lab Results  Component Value Date   WBC 7.0 10/10/2016   HGB 15.8 10/10/2016   HCT 47.6 10/10/2016   PLT 233.0 10/10/2016   GLUCOSE 98 10/10/2016   ALT 16 10/10/2016   AST 26 10/10/2016   NA 141 10/10/2016   K 4.9 10/10/2016   CL 106 10/10/2016   CREATININE 1.10 10/10/2016   BUN 16 10/10/2016   CO2 31 10/10/2016   TSH 1.19 10/10/2016    No results found.   Assessment & Plan:  Plan  I have discontinued Mr. Humes's doxycycline. I am also having him maintain his diazepam, B Complex Vitamins (B COMPLEX 100 PO), multivitamin, Azelastine-Fluticasone, levocetirizine, Collagen, TURMERIC PO, EPINEPHrine, and HYDROcodone-acetaminophen.  No orders of the defined types were placed in this encounter.   Problem List Items Addressed This Visit      Unprioritized   HTN (hypertension)    Well controlled, no changes to meds. Encouraged heart healthy diet such as the DASH diet and exercise as tolerated.       RMSF [facility] spotted fever)    Finished abx          Follow-up: Return for annual exam, fasting.  Ann Held, DO

## 2016-11-08 NOTE — Patient Instructions (Signed)

## 2016-11-08 NOTE — Assessment & Plan Note (Signed)
Well controlled, no changes to meds. Encouraged heart healthy diet such as the DASH diet and exercise as tolerated.  °

## 2016-11-10 ENCOUNTER — Encounter: Payer: Self-pay | Admitting: Family Medicine

## 2016-11-21 ENCOUNTER — Ambulatory Visit (INDEPENDENT_AMBULATORY_CARE_PROVIDER_SITE_OTHER): Payer: Federal, State, Local not specified - PPO | Admitting: Family Medicine

## 2016-11-21 ENCOUNTER — Encounter: Payer: Self-pay | Admitting: Family Medicine

## 2016-11-21 VITALS — BP 138/76 | HR 69 | Temp 97.9°F | Resp 16 | Ht 69.0 in | Wt 166.4 lb

## 2016-11-21 DIAGNOSIS — I10 Essential (primary) hypertension: Secondary | ICD-10-CM | POA: Diagnosis not present

## 2016-11-21 DIAGNOSIS — A770 Spotted fever due to Rickettsia rickettsii: Secondary | ICD-10-CM

## 2016-11-21 NOTE — Patient Instructions (Signed)
     IF you received an x-ray today, you will receive an invoice from [facility]. Please contact [facility] at [phone] with questions or concerns regarding your invoice.   IF you received labwork today, you will receive an invoice from [facility]. Please contact [facility] at [phone] with questions or concerns regarding your invoice.   Our billing staff will not be able to assist you with questions regarding bills from these companies.  You will be contacted with the lab results as soon as they are available. The fastest way to get your results is to activate your My Chart account. Instructions are located on the last page of this paperwork. If you have not heard from us regarding the results in 2 weeks, please contact this office.     

## 2016-11-21 NOTE — Progress Notes (Signed)
Chief Complaint  Patient presents with  . lab work    rocky mountain spotted fever    HPI  Pt here to follow up on his titers after completing doxycycline He still has fatigue but no rashes or skin changes.  Reports that he no longer feels as drained  He has a diet controlled hypertension and his bp levels are good at home He reports that he gets nervous at the doctor He avoids salty foods No chest pains or palpitations BP Readings from Last 3 Encounters:  11/21/16 138/76  11/08/16 128/84  10/16/16 134/82      Past Medical History:  Diagnosis Date  . 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 Anxiety   . Arthritis   . Asthma    slight  . Depression   . Enlarged prostate   . GERD (gastroesophageal reflux disease)   . HTN (hypertension)   . Multiple allergies   . Shortness of breath dyspnea     Current Outpatient Prescriptions  Medication Sig Dispense Refill  . Azelastine-Fluticasone (DYMISTA) 137-50 MCG/ACT SUSP Place 1 spray into the nose daily.    . B Complex Vitamins (B COMPLEX 100 PO) Take 1 tablet by mouth daily.    . Collagen 500 MG CAPS Take 2 capsules by mouth daily.    . diazepam (VALIUM) 5 MG tablet Take 2.5 mg by mouth at bedtime.     Marland Kitchen EPINEPHrine 0.3 mg/0.3 mL IJ SOAJ injection Inject 0.3 mLs into the muscle as needed (allergic reaction).   1  . HYDROcodone-acetaminophen (NORCO/VICODIN) 5-325 MG tablet Take 1-2 tablets by mouth every 6 (six) hours as needed for moderate pain. 30 tablet 0  . levocetirizine (XYZAL) 5 MG tablet Take 5 mg by mouth every evening.    . Multiple Vitamin (MULTIVITAMIN) tablet Take 1 tablet by mouth daily.    . TURMERIC PO Take 1 tablet by mouth daily.     No current facility-administered medications for this visit.     Allergies:  Allergies  Allergen Reactions  . 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 Lactose Intolerance (Gi) Diarrhea    Bloating,gas    Past Surgical History:  Procedure Laterality Date  .  INGUINAL HERNIA REPAIR Bilateral 11/21/2015   Procedure: OPEN BILATERAL INGUINAL HERNIA REPAIR;  Surgeon: [name], MD;  Location: [place];  Service: General;  Laterality: Bilateral;  . INSERTION OF MESH Bilateral 11/21/2015   Procedure: INSERTION OF MESH;  Surgeon: [name], MD;  Location: [place];  Service: General;  Laterality: Bilateral;  . LIPOMA EXCISION N/A 11/21/2015   Procedure: EXCISION TWO ABDOMINAL WALL LIPOMAS;  Surgeon: [name], MD;  Location: [place] [facility];  Service: General;  Laterality: N/A;  . UMBILICAL HERNIA REPAIR  2002    Social History   Social History  . Marital status: Single    Spouse name: N/A  . Number of children: N/A  . Years of education: N/A   Social History Main Topics  . Smoking status: Former Smoker    Types: Pipe, Landscape architect  . Smokeless tobacco: Never Used  . Alcohol use No     Comment: Recovering  . Drug use: No  . Sexual activity: Not Currently   Other Topics Concern  . None   Social History Narrative  . None    ROS See hpi  Objective: Vitals:   11/21/16 1407 11/21/16 1437  BP: (!) 152/82 138/76  Pulse: 69   Resp: 16   Temp:  97.9 F (36.6 C)   TempSrc: Oral   SpO2: 95%   Weight: 166 lb 6.4 oz (75.5 kg)   Height: 5\' 9"  (1.753 m)     Physical Exam  Constitutional: He is oriented to person, place, and time. He appears well-developed and well-nourished.  HENT:  Head: Normocephalic and atraumatic.  Eyes: Conjunctivae and EOM are normal.  Cardiovascular: Normal rate, regular rhythm and normal heart sounds.   Pulmonary/Chest: Effort normal and breath sounds normal. No respiratory distress. He has no wheezes.  Neurological: He is alert and oriented to person, place, and time.  Psychiatric: He has a normal mood and affect. His behavior is normal. Judgment and thought content normal.    Assessment and Plan Devery was seen today for lab work.  Diagnoses and all orders for this visit:  RMSF [facility] spotted fever) -      Rocky mtn spotted fvr ab, IgG-blood     Madalyn Legner A Shante [place]

## 2016-11-25 ENCOUNTER — Telehealth: Payer: Self-pay | Admitting: Family Medicine

## 2016-11-25 LAB — RMSF, IGG, IFA: RMSF, IGG, IFA: 1:256 {titer} — ABNORMAL HIGH

## 2016-11-25 LAB — ROCKY MTN SPOTTED FVR AB, IGG-BLOOD: RMSF IGG: POSITIVE — AB

## 2016-11-25 NOTE — Telephone Encounter (Signed)
Please release labs

## 2016-11-25 NOTE — Telephone Encounter (Signed)
PATIENT WAS IN THURS. (11/21/16) TO SEE DR. STALLINGS. HE SAID HE WAS TOLD TO CALL BACK TODAY TO GET HIS LAB RESULTS TO SEE IF HE STILL HAS ROCKY MOUNTAIN SPOTTED FEVER. BEST PHONE [phone] (HOME) IF HE DOES NOT ANSWER PLEASE LEAVE HIM A MESSAGE. PHARMACY CHOICE IF NEEDED IS [place] ([place]) [place]

## 2016-11-26 NOTE — Telephone Encounter (Signed)
PATIENT CALLED BACK AGAIN TO GET HIS LAB RESULTS. I INFORMED HIM THAT HIS MESSAGE HAS BEEN SENT TO DR. Nolon Rod. BEST PHONE [phone] (CELL) [place]

## 2016-11-27 ENCOUNTER — Ambulatory Visit: Payer: Federal, State, Local not specified - PPO | Admitting: Family Medicine

## 2016-11-29 ENCOUNTER — Other Ambulatory Visit: Payer: Self-pay | Admitting: Family Medicine

## 2016-11-29 MED ORDER — DOXYCYCLINE HYCLATE 100 MG PO TABS
100.0000 mg | ORAL_TABLET | Freq: Two times a day (BID) | ORAL | 0 refills | Status: AC
Start: 2016-11-29 — End: 2016-12-06

## 2016-12-12 ENCOUNTER — Ambulatory Visit (INDEPENDENT_AMBULATORY_CARE_PROVIDER_SITE_OTHER): Payer: Federal, State, Local not specified - PPO | Admitting: Allergy

## 2016-12-12 ENCOUNTER — Encounter: Payer: Self-pay | Admitting: Allergy

## 2016-12-12 VITALS — BP 140/84 | HR 80 | Temp 98.0°F | Resp 20 | Ht 68.5 in | Wt 166.0 lb

## 2016-12-12 DIAGNOSIS — J309 Allergic rhinitis, unspecified: Secondary | ICD-10-CM

## 2016-12-12 DIAGNOSIS — T781XXA Other adverse food reactions, not elsewhere classified, initial encounter: Secondary | ICD-10-CM

## 2016-12-12 DIAGNOSIS — H1010 Acute atopic conjunctivitis, unspecified eye: Secondary | ICD-10-CM

## 2016-12-12 NOTE — Patient Instructions (Signed)
Allergic rhinoconjunctivitis    - continue use of OTC Zyrtec and Fluticasone    - continue allergen avoidance measures    - continue your allergy shots per Dr. [name] protocol at this time    - we are requesting your allergy records from Dr. [name] office   History of tick bites/concern for red meat allergy   - will obtain alpha-gal panel due to history of numerous tick bites and current avoidance of red meat products    - will call with results    - you have access to an epinephrine device   Follow-up 1 year or sooner if needed

## 2016-12-12 NOTE — Progress Notes (Signed)
New Patient Note  RE: Ernest Sanders MRN: [id number] DOB: Apr 08, 1945 Date of Office Visit: 12/12/2016  Referring provider: [name], PA-C Primary care provider: [name], [name], DO  Chief Complaint: concerned for alpha gal allergy  History of present illness: Ernest Sanders is a 71 y.o. male presenting today for consultation for alpha gal evaluation and transfer of care.  He has been following with [name] with [facility] since 2000.  He has history of allergic rhinoconjunctivitis on AIT weekly since 2000.  He states our office is a lot closer than Dr. [name] as to one reason for transfer of care.  For his allergic rhinoconjunctivitis (itchy eyes, sneezing, mild nasal congestion/drainage) he states he takes OTC zyrtec and fluticasone daily.  He recalls being sensitized to pollens and dust.  He states he will sometimes reports wearing a mask to mow the lawn.  He would like to continue AIT but feels he doesn't want to change his current routine at this time.   He is also concerned about having alpha gal allergy as he has been bitten by ticks on numerous occasions.   He states he has been avoiding red meat for the past 2 years but has not been able to have testing done as he reports he was told by nurse at previous allergist he needed to have a delayed reaction following red meat ingestion and however he is hesitant to do this as he does not want to have a reaction.  He does have an epipen due to being on AIT.  He otherwise has not history of food allergy and no other food allergy concerns.      Review of systems: Review of Systems  Constitutional: Negative for chills, fever and malaise/fatigue.  HENT: Negative for congestion, ear discharge, ear pain, nosebleeds, sinus pain, sore throat and tinnitus.   Eyes: Negative for discharge and redness.  Respiratory: Negative for cough, shortness of breath and wheezing.   Cardiovascular: Negative for chest pain.    Gastrointestinal: Negative for abdominal pain, constipation, diarrhea, heartburn, nausea and vomiting.  Musculoskeletal: Negative for joint pain.  Skin: Negative for itching and rash.  Neurological: Negative for headaches.    All other systems negative unless noted above in HPI  Past medical history: Past Medical History:  Diagnosis Date  . 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 Anxiety   . Arthritis   . Asthma    slight  . Depression   . Enlarged prostate   . GERD (gastroesophageal reflux disease)   . HTN (hypertension)   . Multiple allergies   . Shortness of breath dyspnea     Past surgical history: Past Surgical History:  Procedure Laterality Date  . INGUINAL HERNIA REPAIR Bilateral 11/21/2015   Procedure: OPEN BILATERAL INGUINAL HERNIA REPAIR;  Surgeon: [name], MD;  Location: [place];  Service: General;  Laterality: Bilateral;  . INSERTION OF MESH Bilateral 11/21/2015   Procedure: INSERTION OF MESH;  Surgeon: [name], MD;  Location: [place];  Service: General;  Laterality: Bilateral;  . LIPOMA EXCISION N/A 11/21/2015   Procedure: EXCISION TWO ABDOMINAL WALL LIPOMAS;  Surgeon: [name], MD;  Location: [place];  Service: General;  Laterality: N/A;  . UMBILICAL HERNIA REPAIR  2002    Family history:  Family History  Problem Relation Age of Onset  . Drug abuse Father   . Heart disease Father        CHF  . Hypertension Mother   .  Diabetes Mother        Surgical  . Arthritis Unknown   . Stroke Paternal Grandmother     Social history: Lives in home with carpeting with electric heating and central cooling.  There are no pets in the home.  There are lots of deer in the vicinity of his home.  There is no concern for water damage, mildew or roaches in the home.  He is retired from post office since 2003.    Social History Main Topics  . Smoking status: Former Smoker    Types: Pipe, Landscape architect  . Smokeless tobacco: Never Used  . Alcohol use No     Comment:  Recovering     Medication List: Allergies as of 12/12/2016      Reactions   Carrot [daucus Carota] Anaphylaxis   raw   Lactose Intolerance (gi) Diarrhea   Bloating,gas      Medication List       Accurate as of 12/12/16  7:49 PM. Always use your most recent med list.          B COMPLEX 100 PO Take 1 tablet by mouth daily.   cetirizine 10 MG tablet Commonly known as:  ZYRTEC Take 10 mg by mouth daily.   chlordiazePOXIDE 5 MG capsule Commonly known as:  LIBRIUM TK ONE C PO TID PRN   Collagen 500 MG Caps Take 2 capsules by mouth daily.   diazepam 5 MG tablet Commonly known as:  VALIUM Take 2.5 mg by mouth every morning.   EPINEPHrine 0.3 mg/0.3 mL Soaj injection Commonly known as:  EPI-PEN Inject 0.3 mLs into the muscle as needed (allergic reaction).   fluticasone 50 MCG/ACT nasal spray Commonly known as:  FLONASE SHAKE LQ AND U 1 TO 2 SPRAYS IEN QD   multivitamin tablet Take 1 tablet by mouth daily.   TURMERIC PO Take 1 tablet by mouth daily.       Known medication allergies: Allergies  Allergen Reactions  . 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 Lactose Intolerance (Gi) Diarrhea    Bloating,gas     Physical examination: Blood pressure 140/84, pulse 80, temperature 98 F (36.7 C), resp. rate 20, height 5' 8.5" (1.74 m), weight 166 lb (75.3 kg).  General: Alert, interactive, in no acute distress. HEENT: TMs pearly gray, turbinates minimally edematous without discharge, post-pharynx non erythematous. Neck: Supple without lymphadenopathy. Lungs: Clear to auscultation without wheezing, rhonchi or rales. {no increased work of breathing. CV: Normal S1, S2 without murmurs. Abdomen: Nondistended, nontender. Skin: Warm and dry, without lesions or rashes. Extremities:  No clubbing, cyanosis or edema. Neuro:   Grossly intact.  Diagnositics/Labs: None today  Assessment and plan:   Allergic rhinoconjunctivitis    - continue use of OTC Zyrtec  and Fluticasone    - continue allergen avoidance measures    - continue your allergy shots per Dr. [name] protocol at this time.  We did discussed options retesting and making new vials, transfer of current vials and continuation of current AIT or stopping completely as he has already completed >5 years of therapy.  He elected to continue his current routine with weekly injections through Dr. [name] office.     - we are requesting your allergy records from Dr. [name] office  Adverse food reaction   - he is very concerned for red meat allergy and does not want to have a reaction if he has an accidental ingestion or reintroduces red meat into diet   -  will obtain alpha-gal panel due to history of numerous tick bites and current avoidance of red meat products    - will call with results    - you have access to an epinephrine device   Follow-up 1 year or sooner if needed  I appreciate the opportunity to take part in Rylin's care. Please do not hesitate to contact me with questions.  Sincerely,   Prudy Feeler, MD Allergy/Immunology [facility]

## 2016-12-17 LAB — ALPHA-GAL PANEL
Alpha Gal IgE*: <0.1 kU/L (ref <0.35)
Class Interpretation: 0
Class Interpretation: 0
LAMB CLASS INTERPRETATION: 0
Lamb/Mutton (Ovis spp) IgE: <0.1 kU/L (ref <0.35)
Pork (Sus spp) IgE: <0.1 kU/L (ref <0.35)

## 2016-12-19 ENCOUNTER — Telehealth: Payer: Self-pay

## 2016-12-19 NOTE — Telephone Encounter (Signed)
Patient sent a letter advising [place] that he would be continuing his allergy care at [facility] due to his potential move to another state. I did leave a message advising him of the lab results.

## 2017-02-03 ENCOUNTER — Telehealth: Payer: Self-pay | Admitting: Family Medicine

## 2017-02-03 ENCOUNTER — Other Ambulatory Visit: Payer: Self-pay | Admitting: Family Medicine

## 2017-02-03 DIAGNOSIS — Z8619 Personal history of other infectious and parasitic diseases: Secondary | ICD-10-CM

## 2017-02-03 NOTE — Telephone Encounter (Signed)
Copied from [place] [id number]. Topic: Quick Communication - See Telephone Encounter >> Feb 03, 2017 11:20 AM Arletha Grippe wrote: CRM for notification. See Telephone encounter for:   02/03/17. 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 Please call [phone] when orders are in so he can come in for lab

## 2017-02-03 NOTE — Telephone Encounter (Signed)
Order placed patient informed via answering machine.

## 2017-02-03 NOTE — Progress Notes (Signed)
Order placed - patient notified.

## 2017-02-03 NOTE — Telephone Encounter (Signed)
Ok to place order and inform pt

## 2017-02-06 ENCOUNTER — Other Ambulatory Visit (INDEPENDENT_AMBULATORY_CARE_PROVIDER_SITE_OTHER): Payer: Federal, State, Local not specified - PPO

## 2017-02-06 DIAGNOSIS — Z8619 Personal history of other infectious and parasitic diseases: Secondary | ICD-10-CM

## 2017-02-10 LAB — ROCKY MTN SPOTTED FVR ABS PNL(IGG+IGM)
RMSF IGM: NOT DETECTED
RMSF IgG: NOT DETECTED

## 2017-05-15 ENCOUNTER — Telehealth: Payer: Self-pay | Admitting: Podiatry

## 2017-05-15 NOTE — Telephone Encounter (Signed)
Pt called saying that he called and spoke to Dr. Lindley [facility] and they are saying they never received the records I faxed back in January 2019. Pt stated he has been referred to a rheumatologist and to make sure they get them I would like to come pick them up. How can we make this happen? I told Ernest Sanders that I had scanned everything into the computer and I would print out what I was able to find from our old system and put up front for him to pick up. I instructed him that he would need to fill out and sign a new medical records release form since this was a new request. Pt asked me to put the information up front and that he would be by tomorrow to pick it up. I told him that our hours on Friday's are from 7:30 am - 4:00 pm. Pt thanked me for my help.

## 2017-05-20 ENCOUNTER — Encounter: Payer: Self-pay | Admitting: Family Medicine

## 2017-05-20 ENCOUNTER — Ambulatory Visit: Payer: Federal, State, Local not specified - PPO | Admitting: Family Medicine

## 2017-05-20 VITALS — BP 138/80 | HR 75 | Temp 98.1°F | Resp 16 | Ht 69.5 in | Wt 170.2 lb

## 2017-05-20 DIAGNOSIS — M2550 Pain in unspecified joint: Secondary | ICD-10-CM | POA: Diagnosis not present

## 2017-05-20 MED ORDER — MELOXICAM 15 MG PO TABS: ORAL_TABLET | ORAL | 0 refills | Status: DC

## 2017-05-20 NOTE — Progress Notes (Signed)
Subjective:  I acted as a Education administrator for [employer]. Ernest Sanders, Ernest Sanders   Patient ID: Ernest Sanders, male    DOB: January 28, 1946, 72 y.o.   MRN: [id number]  Chief Complaint  Patient presents with  . Arthritis    foot, hands    HPI  Patient is in today with concerns about arthritis and needs referral request for rheumatology.  No known new  injury===  Xray done in 2013 ---  When he stubbed his toe.  Injection done and surgery recommended but not done---  He was seen by Dr Paulla Dolly and Dr Gershon Mussel--- Dr Gershon Mussel recommended he see a rhematologist because of mult joint swelling and pain . Pt also had RMSF so not sure how much that may be affecting his joints.   Patient Care Team: [name], [name], DO as PCP - General (Family Medicine) Leta Baptist, MD as Consulting Physician (Otolaryngology) Mosetta Anis, MD as Referring Physician (Allergy)   Past Medical History:  Diagnosis Date  . 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 Anxiety   . Arthritis   . Asthma    slight  . Depression   . Enlarged prostate   . GERD (gastroesophageal reflux disease)   . HTN (hypertension)   . Multiple allergies   . Shortness of breath dyspnea     Past Surgical History:  Procedure Laterality Date  . INGUINAL HERNIA REPAIR Bilateral 11/21/2015   Procedure: OPEN BILATERAL INGUINAL HERNIA REPAIR;  Surgeon: [name], MD;  Location: [facility];  Service: General;  Laterality: Bilateral;  . INSERTION OF MESH Bilateral 11/21/2015   Procedure: INSERTION OF MESH;  Surgeon: [name], MD;  Location: [place];  Service: General;  Laterality: Bilateral;  . LIPOMA EXCISION N/A 11/21/2015   Procedure: EXCISION TWO ABDOMINAL WALL LIPOMAS;  Surgeon: [name], MD;  Location: [place];  Service: General;  Laterality: N/A;  . UMBILICAL HERNIA REPAIR  2002    Family History  Problem Relation Age of Onset  . Drug abuse Father   . Heart disease Father        CHF  . Hypertension Mother   . Diabetes Mother        Surgical  .  Arthritis Unknown   . Stroke Paternal Grandmother     Social History   Socioeconomic History  . Marital status: Single    Spouse name: Not on file  . Number of children: Not on file  . Years of education: Not on file  . Highest education level: Not on file  Social Needs  . Financial resource strain: Not on file  . Food insecurity - worry: Not on file  . Food insecurity - inability: Not on file  . Transportation needs - medical: Not on file  . Transportation needs - non-medical: Not on file  Occupational History  . Not on file  Tobacco Use  . Smoking status: Former Smoker    Types: Pipe, Landscape architect  . Smokeless tobacco: Never Used  Substance and Sexual Activity  . Alcohol use: No    Comment: Recovering  . Drug use: No  . Sexual activity: Not Currently  Other Topics Concern  . Not on file  Social History Narrative  . Not on file    Outpatient Medications Prior to Visit  Medication Sig Dispense Refill  . B Complex Vitamins (B COMPLEX 100 PO) Take 1 tablet by mouth daily.    . cetirizine (ZYRTEC) 10 MG tablet Take 10 mg by mouth daily.    Marland Kitchen  chlordiazePOXIDE (LIBRIUM) 5 MG capsule TK ONE C PO TID PRN  1  . Collagen 500 MG CAPS Take 2 capsules by mouth daily.    . diazepam (VALIUM) 5 MG tablet Take 2.5 mg by mouth every morning.     Marland Kitchen EPINEPHrine 0.3 mg/0.3 mL IJ SOAJ injection Inject 0.3 mLs into the muscle as needed (allergic reaction).   1  . fluticasone (FLONASE) 50 MCG/ACT nasal spray SHAKE LQ AND U 1 TO 2 SPRAYS IEN QD  6  . Multiple Vitamin (MULTIVITAMIN) tablet Take 1 tablet by mouth daily.    . TURMERIC PO Take 1 tablet by mouth daily.     No facility-administered medications prior to visit.     Allergies  Allergen Reactions  . 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 Lactose Intolerance (Gi) Diarrhea    Bloating,gas    Review of Systems  Constitutional: Negative for chills, fever and malaise/fatigue.  HENT: Negative for congestion and hearing loss.     Eyes: Negative for discharge.  Respiratory: Negative for cough, sputum production and shortness of breath.   Cardiovascular: Negative for chest pain, palpitations and leg swelling.  Gastrointestinal: Negative for abdominal pain, blood in stool, constipation, diarrhea, heartburn, nausea and vomiting.  Genitourinary: Negative for dysuria, frequency, hematuria and urgency.  Musculoskeletal: Positive for joint pain and myalgias. Negative for back pain and falls.  Skin: Negative for rash.  Neurological: Negative for dizziness, sensory change, loss of consciousness, weakness and headaches.  Endo/Heme/Allergies: Negative for environmental allergies. Does not bruise/bleed easily.  Psychiatric/Behavioral: Negative for depression and suicidal ideas. The patient is not nervous/anxious and does not have insomnia.        Objective:    Physical Exam  Constitutional: He is oriented to person, place, and time. Vital signs are normal. He appears well-developed and well-nourished. He is sleeping.  HENT:  Head: Normocephalic and atraumatic.  Mouth/Throat: Oropharynx is clear and moist.  Eyes: EOM are normal. Pupils are equal, round, and reactive to light.  Neck: Normal range of motion. Neck supple. No thyromegaly present.  Cardiovascular: Normal rate and regular rhythm.  No murmur heard. Pulmonary/Chest: Effort normal and breath sounds normal. No respiratory distress. He has no wheezes. He has no rales. He exhibits no tenderness.  Musculoskeletal: He exhibits tenderness and deformity. He exhibits no edema.       Left wrist: He exhibits tenderness and deformity.       Right ankle: No tenderness.       Arms:      Right foot: There is tenderness and deformity.       Feet:  Neurological: He is alert and oriented to person, place, and time.  Skin: Skin is warm and dry.  Psychiatric: He has a normal mood and affect. His behavior is normal. Judgment and thought content normal.  Nursing note and vitals  reviewed.   BP 138/80 (BP Location: Left Arm, Patient Position: Sitting, Cuff Size: Normal)   Pulse 75   Temp 98.1 F (36.7 C) (Oral)   Resp 16   Ht 5' 9.5" (1.765 m)   Wt 170 lb 3.2 oz (77.2 kg)   SpO2 98%   BMI 24.77 kg/m  Wt Readings from Last 3 Encounters:  05/20/17 170 lb 3.2 oz (77.2 kg)  12/12/16 166 lb (75.3 kg)  11/21/16 166 lb 6.4 oz (75.5 kg)   BP Readings from Last 3 Encounters:  05/20/17 138/80  12/12/16 140/84  11/21/16 138/76     Immunization  History  Administered Date(s) Administered  . Tetanus 07/02/2016  . Zoster 03/04/2013    Health Maintenance  Topic Date Due  . Hepatitis C Screening  Jul 10, 1945  . PNA vac Low Risk Adult (1 of 2 - PCV13) 11/15/2011  . INFLUENZA VACCINE  06/01/2017 (Originally 10/02/2016)  . COLONOSCOPY  04/27/2022  . TETANUS/TDAP  07/03/2026    Lab Results  Component Value Date   WBC 7.0 10/10/2016   HGB 15.8 10/10/2016   HCT 47.6 10/10/2016   PLT 233.0 10/10/2016   GLUCOSE 98 10/10/2016   ALT 16 10/10/2016   AST 26 10/10/2016   NA 141 10/10/2016   K 4.9 10/10/2016   CL 106 10/10/2016   CREATININE 1.10 10/10/2016   BUN 16 10/10/2016   CO2 31 10/10/2016   TSH 1.19 10/10/2016    Lab Results  Component Value Date   TSH 1.19 10/10/2016   Lab Results  Component Value Date   WBC 7.0 10/10/2016   HGB 15.8 10/10/2016   HCT 47.6 10/10/2016   MCV 98.1 10/10/2016   PLT 233.0 10/10/2016   Lab Results  Component Value Date   NA 141 10/10/2016   K 4.9 10/10/2016   CO2 31 10/10/2016   GLUCOSE 98 10/10/2016   BUN 16 10/10/2016   CREATININE 1.10 10/10/2016   BILITOT 0.5 10/10/2016   ALKPHOS 53 10/10/2016   AST 26 10/10/2016   ALT 16 10/10/2016   PROT 7.6 10/10/2016   ALBUMIN 4.4 10/10/2016   CALCIUM 10.2 10/10/2016   ANIONGAP 6 11/14/2015   GFR 70.36 10/10/2016   No results found for: CHOL No results found for: HDL No results found for: LDLCALC No results found for: TRIG No results found for: CHOLHDL No  results found for: HGBA1C       Assessment & Plan:   Problem List Items Addressed This Visit    None    Visit Diagnoses    Arthralgia, unspecified joint    -  Primary   Relevant Medications   meloxicam (MOBIC) 15 MG tablet   Other Relevant Orders   CBC with Differential/Platelet   Comprehensive metabolic panel   Vitamin D 1,25 dihydroxy   Sedimentation rate   Rheumatoid Factor   Antinuclear Antib (ANA)   Ambulatory referral to Rheumatology    not sure if RMSF has affected his joints as well  Check labs and try mobic  Will refer to rheum for further evaluation at pt request  I am having Pete Glatter. Stalker start on meloxicam. I am also having him maintain his diazepam, B Complex Vitamins (B COMPLEX 100 PO), multivitamin, Collagen, TURMERIC PO, EPINEPHrine, chlordiazePOXIDE, fluticasone, and cetirizine.  Meds ordered this encounter  Medications  . meloxicam (MOBIC) 15 MG tablet    Sig: 1/2 -1 po qd prn    Dispense:  30 tablet    Refill:  0    CMA served as scribe during this visit. History, Physical and Plan performed by medical provider. Documentation and orders reviewed and attested to.  Ann Held, DO

## 2017-05-20 NOTE — Patient Instructions (Signed)

## 2017-05-21 ENCOUNTER — Other Ambulatory Visit (INDEPENDENT_AMBULATORY_CARE_PROVIDER_SITE_OTHER): Payer: Federal, State, Local not specified - PPO

## 2017-05-21 DIAGNOSIS — M2550 Pain in unspecified joint: Secondary | ICD-10-CM | POA: Diagnosis not present

## 2017-05-21 LAB — CBC WITH DIFFERENTIAL/PLATELET
BASOS PCT: 0.7 % (ref 0.0–3.0)
Basophils Absolute: 0 10*3/uL (ref 0.0–0.1)
EOS ABS: 0.1 10*3/uL (ref 0.0–0.7)
Eosinophils Relative: 2.2 % (ref 0.0–5.0)
HEMATOCRIT: 44.2 % (ref 39.0–52.0)
HEMOGLOBIN: 15.3 g/dL (ref 13.0–17.0)
LYMPHS PCT: 18.6 % (ref 12.0–46.0)
Lymphs Abs: 1.3 10*3/uL (ref 0.7–4.0)
MCHC: 34.5 g/dL (ref 30.0–36.0)
MCV: 95.9 fl (ref 78.0–100.0)
MONO ABS: 0.5 10*3/uL (ref 0.1–1.0)
Monocytes Relative: 6.8 % (ref 3.0–12.0)
NEUTROS ABS: 4.9 10*3/uL (ref 1.4–7.7)
Neutrophils Relative %: 71.7 % (ref 43.0–77.0)
PLATELETS: 219 10*3/uL (ref 150.0–400.0)
RBC: 4.61 Mil/uL (ref 4.22–5.81)
RDW: 13.3 % (ref 11.5–15.5)
WBC: 6.8 10*3/uL (ref 4.0–10.5)

## 2017-05-21 LAB — SEDIMENTATION RATE: Sed Rate: 11 mm/hr (ref 0–20)

## 2017-05-22 LAB — COMPREHENSIVE METABOLIC PANEL
ALT: 14 U/L (ref 0–53)
AST: 26 U/L (ref 0–37)
Albumin: 4.4 g/dL (ref 3.5–5.2)
Alkaline Phosphatase: 48 U/L (ref 39–117)
BUN: 20 mg/dL (ref 6–23)
CALCIUM: 9.9 mg/dL (ref 8.4–10.5)
CHLORIDE: 107 meq/L (ref 96–112)
CO2: 28 meq/L (ref 19–32)
CREATININE: 1.1 mg/dL (ref 0.40–1.50)
GFR: 70.23 mL/min (ref >60.00)
Glucose, Bld: 119 mg/dL — ABNORMAL HIGH (ref 70–99)
Potassium: 4.7 mEq/L (ref 3.5–5.1)
SODIUM: 143 meq/L (ref 135–145)
Total Bilirubin: 0.2 mg/dL (ref 0.2–1.2)
Total Protein: 7.1 g/dL (ref 6.0–8.3)

## 2017-05-24 LAB — ANA: Anti Nuclear Antibody(ANA): NEGATIVE

## 2017-05-24 LAB — VITAMIN D 1,25 DIHYDROXY
Vitamin D 1, 25 (OH)2 Total: 30 pg/mL (ref 18–72)
Vitamin D2 1, 25 (OH)2: <8 pg/mL
Vitamin D3 1, 25 (OH)2: 30 pg/mL

## 2017-05-24 LAB — RHEUMATOID FACTOR: Rhuematoid fact SerPl-aCnc: <14 IU/mL (ref <14)

## 2017-06-24 ENCOUNTER — Other Ambulatory Visit: Payer: Self-pay | Admitting: Family Medicine

## 2017-06-24 DIAGNOSIS — M2550 Pain in unspecified joint: Secondary | ICD-10-CM

## 2017-06-24 NOTE — Progress Notes (Signed)
Office Visit Note  Patient: Ernest Sanders             Date of Birth: 1945/12/26           MRN: [id number]             PCP: Ann Held, DO Referring: Ann Held, * Visit Date: 06/25/2017 Occupation: Retired Tour manager    Subjective:  Pain in multiple joints.   History of Present Illness: Ernest Sanders is a 72 y.o. male seen in consultation per request of his PCP.  According to patient he has had arthritis in his hands for multiple years.  Last summer he had about 6-8 bites and he states 1 of the tick bite left bull's-eye lesion.  At the same time he injured his left fifth finger for that reason he had to take tetracycline for 10 days.  He states that summer he started experiencing increased muscle weakness and fatigue.  He also had nausea and decreased appetite.  He was unable to do push-ups like he is to do before.  He was having difficulty even going his lawn.  The symptoms gradually improved but he started having increased pain and discomfort in his bilateral hands especially his left wrist joint.  He states he had a left wrist fracture in the past but is been more swollen lately.  He is also had arthritis in his knee joints for a while and he started having increased pain in his bilateral knee joints.  He fell in 2016 and had increased pain in his right knee which gradually improved.  He has been also having some discomfort in his right foot for which he is seen Dr. Paulla Dolly who suggested surgery.  He gives history of degenerative disc disease in the C-spine.  None of the other joints are swollen.  Activities of Daily Living:  Patient reports morning stiffness for 0  minutes.   Patient Reports nocturnal pain.  Difficulty dressing/grooming: Denies Difficulty climbing stairs: Reports Difficulty getting out of chair: Reports Difficulty using hands for taps, buttons, cutlery, and/or writing: Reports   Review of Systems  Constitutional: Positive for fatigue.  Negative for night sweats.  HENT: Positive for mouth dryness. Negative for mouth sores, trouble swallowing, trouble swallowing and nose dryness.   Eyes: Positive for dryness. Negative for pain, redness and visual disturbance.  Respiratory: Negative for cough, hemoptysis, shortness of breath and difficulty breathing.   Cardiovascular: Negative for chest pain, palpitations, hypertension, irregular heartbeat and swelling in legs/feet.  Gastrointestinal: Positive for diarrhea. Negative for blood in stool and constipation.  Endocrine: Negative for increased urination.  Genitourinary: Negative for painful urination.  Musculoskeletal: Positive for arthralgias, joint pain, joint swelling, myalgias and myalgias. Negative for muscle weakness, morning stiffness and muscle tenderness.  Skin: Positive for sensitivity to sunlight. Negative for color change, rash, hair loss, nodules/bumps, skin tightness and ulcers.  Allergic/Immunologic: Negative for susceptible to infections.  Neurological: Negative for dizziness, fainting, memory loss, night sweats and weakness.  Hematological: Negative for swollen glands.  Psychiatric/Behavioral: Positive for depressed mood. Negative for sleep disturbance. The patient is not nervous/anxious.     PMFS History:  Patient Active Problem List   Diagnosis Date Noted  . RMSF [facility] spotted fever) 11/08/2016  . Bilateral inguinal hernia without obstruction or gangrene 10/21/2015  . HTN (hypertension) 04/27/2012    Past Medical History:  Diagnosis Date  . 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 Anxiety   .  Arthritis   . Asthma    slight  . Depression   . Enlarged prostate   . GERD (gastroesophageal reflux disease)   . HTN (hypertension)   . Multiple allergies   . Shortness of breath dyspnea     Family History  Problem Relation Age of Onset  . Heart disease Father        CHF  . Hypertension Mother   . Diabetes Mother        Surgical  . Arthritis  Unknown   . Stroke Paternal Grandmother    Past Surgical History:  Procedure Laterality Date  . INGUINAL HERNIA REPAIR Bilateral 11/21/2015   Procedure: OPEN BILATERAL INGUINAL HERNIA REPAIR;  Surgeon: [name], MD;  Location: [place];  Service: General;  Laterality: Bilateral;  . INSERTION OF MESH Bilateral 11/21/2015   Procedure: INSERTION OF MESH;  Surgeon: [name], MD;  Location: [place];  Service: General;  Laterality: Bilateral;  . LIPOMA EXCISION N/A 11/21/2015   Procedure: EXCISION TWO ABDOMINAL WALL LIPOMAS;  Surgeon: [name], MD;  Location: [place];  Service: General;  Laterality: N/A;  . UMBILICAL HERNIA REPAIR  2002   Social History   Social History Narrative  . Not on file     Objective: Vital Signs: BP 138/86 (BP Location: Left Arm, Patient Position: Sitting, Cuff Size: Normal)   Pulse 74   Resp 15   Ht 5\' 9"  (1.753 m)   Wt 168 lb (76.2 kg)   BMI 24.81 kg/m    Physical Exam  Constitutional: He is oriented to person, place, and time. He appears well-developed and well-nourished.  HENT:  Head: Normocephalic and atraumatic.  Eyes: Pupils are equal, round, and reactive to light. Conjunctivae and EOM are normal.  Neck: Normal range of motion. Neck supple.  Cardiovascular: Normal rate, regular rhythm and normal heart sounds.  Pulmonary/Chest: Effort normal and breath sounds normal.  Abdominal: Soft. Bowel sounds are normal.  Neurological: He is alert and oriented to person, place, and time.  Skin: Skin is warm and dry. Capillary refill takes less than 2 seconds.  Psychiatric: He has a normal mood and affect. His behavior is normal.  Nursing note and vitals reviewed.    Musculoskeletal Exam: C-spine limited range of motion with lateral rotation.  Flexion and extension was full.  Thoracic and lumbar spine were good range of motion.  Shoulder joints elbow joints were in good range of motion.  He has subluxation of his left wrist joint due to prior fracture.   Some thickening over bilateral wrist joints was noted.  He has DIP PIP thickening bilaterally with subluxation of several of the DIP PIP joints due to severe osteoarthritis.  Atrophy of extensor muscles of the hand was noted.  Thenar eminence loss was noted.  He had decreased grip strength in his bilateral hands.  Hip joints, knee joints, ankles were in good range of motion.  He has warmth and swelling a small effusion in his right knee joint.  He has some DIP PIP thickening in his bilateral feet.  CDAI Exam: No CDAI exam completed.    Investigation: No additional findings.  Component     Latest Ref Rng & Units 05/21/2017  Sed Rate     0 - 20 mm/hr 11  RA Latex Turbid.     <14 IU/mL <14  Anit Nuclear Antibody(ANA)     NEGATIVE NEGATIVE   Component     Latest Ref Rng & Units 02/06/2017  RMSF, IgG, EIA  NOT DETECT NOT DETECTED  [place] Spotted Fever, IgM     NOT DETECT NOT DETECTED   CBC Latest Ref Rng & Units 05/21/2017 10/10/2016 11/14/2015  WBC 4.0 - 10.5 K/uL 6.8 7.0 5.0  Hemoglobin 13.0 - 17.0 g/dL 15.3 15.8 15.9  Hematocrit 39.0 - 52.0 % 44.2 47.6 47.1  Platelets 150.0 - 400.0 K/uL 219.0 233.0 195   CMP Latest Ref Rng & Units 05/21/2017 10/10/2016 11/14/2015  Glucose 70 - 99 mg/dL 119(H) 98 131(H)  BUN 6 - 23 mg/dL 20 16 12   Creatinine 0.40 - 1.50 mg/dL 1.10 1.10 0.97  Sodium 135 - 145 mEq/L 143 141 140  Potassium 3.5 - 5.1 mEq/L 4.7 4.9 4.1  Chloride 96 - 112 mEq/L 107 106 110  CO2 19 - 32 mEq/L 28 31 24   Calcium 8.4 - 10.5 mg/dL 9.9 10.2 9.2  Total Protein 6.0 - 8.3 g/dL 7.1 7.6 7.1  Total Bilirubin 0.2 - 1.2 mg/dL 0.2 0.5 0.6  Alkaline Phos 39 - 117 U/L 48 53 42  AST 0 - 37 U/L 26 26 31   ALT 0 - 53 U/L 14 16 21     Imaging: Xr Foot 2 Views Left  Result Date: 06/25/2017 PIP and DIP narrowing was noted.  Mild first MTP narrowing was noted.  No intertarsal joint space narrowing was noted. Impression: These findings are consistent with osteoarthritis of the foot.  Xr  Foot 2 Views Right  Result Date: 06/25/2017 First MTP and second MTP narrowing was noted.  PIP and DIP narrowing was noted.  No intertarsal joint space narrowing was noted.  No tibiotalar joint space narrowing was present. Impression: These findings are consistent with osteoarthritis of the foot and posttraumatic change in the second MTP joint.  Xr Hand 2 View Left  Result Date: 06/25/2017 PIP DIP and CMC narrowing was noted.  No MCP changes were noted.  No intercarpal changes were noted.  He does have some changes in his wrist due to prior fracture. Impression: These findings are consistent with osteoarthritis of the hand.  Xr Hand 2 View Right  Result Date: 06/25/2017 PIP and DIP narrowing was noted.  Subluxation of the first and second DIP and third PIP was noted.  No intercarpal radiocarpal narrowing was noted.  No erosive changes were noted.  CMC narrowing was noted. Impression: These findings are consistent with osteoarthritis of the hand.  Xr Knee 3 View Left  Result Date: 06/25/2017 Moderate medial compartment narrowing was noted.  Intercondylar osteophytes were noted.  Moderate patellofemoral narrowing was noted.  No chondrocalcinosis was noted. Impression: These findings are consistent with moderate osteoarthritis and moderate chondromalacia patella.  Xr Knee 3 View Right  Result Date: 06/25/2017 Moderate medial compartment narrowing was noted.  Intercondylar osteophytes were noted.  Moderate patellofemoral narrowing was noted.  No chondrocalcinosis was noted. Impression: These findings are consistent with moderate osteoarthritis and moderate chondromalacia patella.   Speciality Comments: No specialty comments available.    Procedures:  Large Joint Inj: R knee on 06/25/2017 9:46 AM Indications: pain Details: 27 G 1.5 in needle, medial approach  Arthrogram: No  Medications: 40 mg triamcinolone acetonide 40 MG/ML; 3 mL lidocaine 1 % Aspirate: 0.5 mL blood-tinged Outcome:  tolerated well, no immediate complications Procedure, treatment alternatives, risks and benefits explained, specific risks discussed. Consent was given by the patient. Immediately prior to procedure a time out was called to verify the correct patient, procedure, equipment, support staff and site/side marked as required. Patient was prepped and draped  in the usual sterile fashion.     Allergies: Carrot [daucus carota] and Lactose intolerance (gi)   Assessment / Plan:     Visit Diagnoses: Polyarthralgia -patient complains of pain in multiple joints including his hands, bilateral knee joints and bilateral feet.  He gives history of intermittent swelling in his wrist joints in his right knee joint.  I did not see any synovitis in his wrists or hands today.  He had some warmth and swelling in his right knee joint.  He has some changes in his left wrist due to prior fracture in 1973. Sed rate WNL, ANA-, and RF -  Pain in both hands -clinical findings are consistent with osteoarthritis of his hands.  Left wrist is subluxed due to fracture.  He does have some tenderness over bilateral wrist joints.  Plan: XR Hand 2 View Right, XR Hand 2 View Left  Chronic pain of both knees -he had warmth and swelling in his right knee joint.  A small effusion was noted.  After informed consent was obtained I asked try to aspirate the right knee joint but not much fluid was aspirated.  The knee joint was injected with cortisone.  Plan: XR KNEE 3 VIEW RIGHT, XR KNEE 3 VIEW LEFT  Pain in both feet -he has some osteoarthritic changes in his feet on clinical examination.  He is seen Dr. Paulla Dolly in the past who suggested surgery.  Plan: XR Foot 2 Views Right, XR Foot 2 Views Left  DDD (degenerative disc disease), cervical-he has severe DJD of the cervical spine which are reviewed on the previous x-ray.  He does have some discomfort in the C-spine.  I am concerned because he has lost muscle mass in his upper extremities and has  decreased grip strength.  He also has muscle atrophy in his upper extremities especially in the extensors of his hands.  I will refer him to neurology.  Atrophy of muscle of hand, unspecified laterality  Other fatigue-he has been experiencing increased fatigue since last year.  He relates it to recurrent infections.  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  He was treated with tetracycline for 21 days.  Essential hypertension  History of asthma  History of gastroesophageal reflux (GERD)  Recurrent infections    Orders: Orders Placed This Encounter  Procedures  . Large Joint Inj: R knee  . XR Hand 2 View Right  . XR Hand 2 View Left  . XR KNEE 3 VIEW RIGHT  . XR KNEE 3 VIEW LEFT  . XR Foot 2 Views Right  . XR Foot 2 Views Left  . CK  . TSH  . Uric acid  . Angiotensin converting enzyme  . Cyclic citrul peptide antibody, IgG  . B. burgdorfi antibodies  . Ambulatory referral to Neurology   No orders of the defined types were placed in this encounter.   Face-to-face time spent with patient was 50 minutes.  Greater than 50% of time was spent in counseling and coordination of care.  Follow-Up Instructions: Return for Polyarthralgia DDD.   Bo Merino, MD  Note - This record has been created using Editor, commissioning.  Chart creation errors have been sought, but may not always  have been located. Such creation errors do not reflect on  the standard of medical care.

## 2017-06-25 ENCOUNTER — Ambulatory Visit (INDEPENDENT_AMBULATORY_CARE_PROVIDER_SITE_OTHER): Payer: Self-pay

## 2017-06-25 ENCOUNTER — Encounter: Payer: Self-pay | Admitting: Rheumatology

## 2017-06-25 ENCOUNTER — Ambulatory Visit: Payer: Federal, State, Local not specified - PPO | Admitting: Rheumatology

## 2017-06-25 ENCOUNTER — Encounter: Payer: Self-pay | Admitting: Neurology

## 2017-06-25 VITALS — BP 138/86 | HR 74 | Resp 15 | Ht 69.0 in | Wt 168.0 lb

## 2017-06-25 DIAGNOSIS — I10 Essential (primary) hypertension: Secondary | ICD-10-CM

## 2017-06-25 DIAGNOSIS — Z8719 Personal history of other diseases of the digestive system: Secondary | ICD-10-CM

## 2017-06-25 DIAGNOSIS — M2550 Pain in unspecified joint: Secondary | ICD-10-CM | POA: Diagnosis not present

## 2017-06-25 DIAGNOSIS — M62549 Muscle wasting and atrophy, not elsewhere classified, unspecified hand: Secondary | ICD-10-CM | POA: Diagnosis not present

## 2017-06-25 DIAGNOSIS — M5030 Other cervical disc degeneration, unspecified cervical region: Secondary | ICD-10-CM | POA: Diagnosis not present

## 2017-06-25 DIAGNOSIS — Z8709 Personal history of other diseases of the respiratory system: Secondary | ICD-10-CM

## 2017-06-25 DIAGNOSIS — B999 Unspecified infectious disease: Secondary | ICD-10-CM | POA: Diagnosis not present

## 2017-06-25 DIAGNOSIS — R5383 Other fatigue: Secondary | ICD-10-CM | POA: Diagnosis not present

## 2017-06-25 DIAGNOSIS — M25562 Pain in left knee: Secondary | ICD-10-CM | POA: Diagnosis not present

## 2017-06-25 DIAGNOSIS — Z8619 Personal history of other infectious and parasitic diseases: Secondary | ICD-10-CM | POA: Diagnosis not present

## 2017-06-25 DIAGNOSIS — M79641 Pain in right hand: Secondary | ICD-10-CM | POA: Diagnosis not present

## 2017-06-25 DIAGNOSIS — M79672 Pain in left foot: Secondary | ICD-10-CM | POA: Diagnosis not present

## 2017-06-25 DIAGNOSIS — M79671 Pain in right foot: Secondary | ICD-10-CM | POA: Diagnosis not present

## 2017-06-25 DIAGNOSIS — M79642 Pain in left hand: Secondary | ICD-10-CM

## 2017-06-25 DIAGNOSIS — G8929 Other chronic pain: Secondary | ICD-10-CM

## 2017-06-25 DIAGNOSIS — M25561 Pain in right knee: Secondary | ICD-10-CM

## 2017-06-25 LAB — TSH: TSH: 4.34 mIU/L (ref 0.40–4.50)

## 2017-06-25 LAB — URIC ACID: Uric Acid, Serum: 7.1 mg/dL (ref 4.0–8.0)

## 2017-06-25 LAB — CK: CK TOTAL: 97 U/L (ref 44–196)

## 2017-06-25 MED ORDER — LIDOCAINE HCL 1 % IJ SOLN
3.0000 mL | INTRAMUSCULAR | Status: AC | PRN
  Administered 2017-06-25: 3 mL

## 2017-06-25 MED ORDER — TRIAMCINOLONE ACETONIDE 40 MG/ML IJ SUSP
40.0000 mg | INTRAMUSCULAR | Status: AC | PRN
  Administered 2017-06-25: 40 mg via INTRA_ARTICULAR

## 2017-06-26 LAB — B. BURGDORFI ANTIBODIES

## 2017-06-26 LAB — CYCLIC CITRUL PEPTIDE ANTIBODY, IGG: CYCLIC CITRULLIN PEPTIDE AB: 17 U

## 2017-06-26 LAB — ANGIOTENSIN CONVERTING ENZYME: Angiotensin-Converting Enzyme: 62 U/L (ref 9–67)

## 2017-06-26 NOTE — Progress Notes (Signed)
WNL

## 2017-07-11 ENCOUNTER — Ambulatory Visit: Payer: Federal, State, Local not specified - PPO | Admitting: Rheumatology

## 2017-08-01 NOTE — Progress Notes (Signed)
Office Visit Note  Patient: Ernest Sanders             Date of Birth: 1945-11-25           MRN: [id number]             PCP: Ann Held, DO Referring: Ann Held, * Visit Date: 08/06/2017 Occupation: @GUAROCC @    Subjective:  Right shoulder pain and pain in hands   History of Present Illness: Ernest Sanders is a 72 y.o. male with history of osteoarthritis.  He continues to have pain and discomfort in multiple joints including his bilateral hands knee joints and feet.  He states his right shoulder joint has been causing a lot of discomfort.  He recalls that several years ago he had an injury to his right shoulder at the time he was seen by Dr. Gladstone Lighter who diagnosed him with possible partial rotator cuff tear and did not advise surgery.  He continues to have some C-spine discomfort.  His right knee is doing better after the cortisone injection.  Activities of Daily Living:  Patient reports morning stiffness for 10 minutes.   Patient Denies nocturnal pain.  Difficulty dressing/grooming: Denies Difficulty climbing stairs: Denies Difficulty getting out of chair: Denies Difficulty using hands for taps, buttons, cutlery, and/or writing: Reports   Review of Systems  Constitutional: Negative for fatigue and night sweats.  HENT: Negative for mouth sores, mouth dryness and nose dryness.   Eyes: Negative for redness and dryness.  Respiratory: Positive for shortness of breath. Negative for difficulty breathing.        History of asthma  Cardiovascular: Positive for hypertension. Negative for chest pain, palpitations, irregular heartbeat and swelling in legs/feet.  Gastrointestinal: Negative for constipation and diarrhea.  Endocrine: Negative for increased urination.  Genitourinary: Positive for urgency.  Musculoskeletal: Positive for arthralgias, joint pain and morning stiffness. Negative for joint swelling, myalgias, muscle weakness, muscle tenderness and  myalgias.  Skin: Negative for color change, rash, hair loss, nodules/bumps, skin tightness, ulcers and sensitivity to sunlight.  Allergic/Immunologic: Negative for susceptible to infections.  Neurological: Negative for dizziness, fainting, memory loss, night sweats and weakness ( ).  Hematological: Negative for swollen glands.  Psychiatric/Behavioral: Negative for depressed mood and sleep disturbance. The patient is not nervous/anxious.     PMFS History:  Patient Active Problem List   Diagnosis Date Noted  . Primary osteoarthritis of both hands 08/05/2017  . Primary osteoarthritis of both knees 08/05/2017  . Primary osteoarthritis of both feet 08/05/2017  . DDD (degenerative disc disease), cervical 08/05/2017  . Atrophy of muscle of both hands 08/05/2017  . History of gastroesophageal reflux (GERD) 08/05/2017  . History of asthma 08/05/2017  . Recurrent infections 08/05/2017  . RMSF [facility] spotted fever) 11/08/2016  . Bilateral inguinal hernia without obstruction or gangrene 10/21/2015  . HTN (hypertension) 04/27/2012    Past Medical History:  Diagnosis Date  . 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 Anxiety   . Arthritis   . Asthma    slight  . Depression   . Enlarged prostate   . GERD (gastroesophageal reflux disease)   . HTN (hypertension)   . Multiple allergies   . Shortness of breath dyspnea     Family History  Problem Relation Age of Onset  . Heart disease Father        CHF  . Hypertension Mother   . Diabetes Mother  Surgical  . Arthritis Unknown   . Stroke Paternal Grandmother    Past Surgical History:  Procedure Laterality Date  . INGUINAL HERNIA REPAIR Bilateral 11/21/2015   Procedure: OPEN BILATERAL INGUINAL HERNIA REPAIR;  Surgeon: [name], MD;  Location: [place];  Service: General;  Laterality: Bilateral;  . INSERTION OF MESH Bilateral 11/21/2015   Procedure: INSERTION OF MESH;  Surgeon: [name], MD;  Location: [place];  Service:  General;  Laterality: Bilateral;  . LIPOMA EXCISION N/A 11/21/2015   Procedure: EXCISION TWO ABDOMINAL WALL LIPOMAS;  Surgeon: [name], MD;  Location: [place];  Service: General;  Laterality: N/A;  . UMBILICAL HERNIA REPAIR  2002   Social History   Social History Narrative  . Not on file     Objective: Vital Signs: BP (!) 142/80 (BP Location: Left Arm, Patient Position: Sitting, Cuff Size: Small)   Pulse 68   Resp 13   Ht 5\' 10"  (1.778 m)   Wt 165 lb (74.8 kg)   BMI 23.68 kg/m    Physical Exam  Constitutional: He is oriented to person, place, and time. He appears well-developed and well-nourished.  HENT:  Head: Normocephalic and atraumatic.  Eyes: Pupils are equal, round, and reactive to light. Conjunctivae and EOM are normal.  Neck: Normal range of motion. Neck supple.  Cardiovascular: Normal rate, regular rhythm and normal heart sounds.  Pulmonary/Chest: Effort normal and breath sounds normal.  Abdominal: Soft. Bowel sounds are normal.  Neurological: He is alert and oriented to person, place, and time.  Skin: Skin is warm and dry. Capillary refill takes less than 2 seconds.  Psychiatric: He has a normal mood and affect. His behavior is normal.  Nursing note and vitals reviewed.    Musculoskeletal Exam: He has limited range of motion of his cervical spine.  He has painful range of motion of his right shoulder joint with some limitation.  Elbow joints were good range of motion.  He has some thickening of PIP and DIP joints.  He has tenderness across MCPs.  Hip joints knee joints ankles MTPs PIPs a good range of motion.  Some crepitus in his knee joints.  The swelling in his right knee joint is resolved.  CDAI Exam: No CDAI exam completed.    Investigation: No additional findings. June 25, 2017 CK normal, TSH normal, uric acid 7.1, ACE 62, anti-CCP negative, Borrelia IgG and IgM index negative  Imaging: [place] Extrem Up Bilat Comp  Result Date: 08/06/2017 Ultrasound  examination of bilateral hands was performed per EULAR recommendations. Using 12 MHz transducer, grayscale and power Doppler bilateral second, third, and fifth MCP joints and bilateral wrist joints both dorsal and volar aspects were evaluated to look for synovitis or tenosynovitis. The findings were there was no synovitis or tenosynovitis on ultrasound examination. Right median nerve was 0.10 cm squares which was within normal limits and left median nerve was 0.07 cm squares which was within normal limits. Impression: Ultrasound examination not show any synovitis or tenosynovitis on examination.  Bilateral median nerves are within normal limits.  Xr Shoulder Right  Result Date: 08/06/2017 No glenohumeral joint space narrowing was noted.  No acromioclavicular joint space narrowing was noted.  High rising humerus was noted.   Speciality Comments: No specialty comments available.    Procedures:  Large Joint Inj: R glenohumeral on 08/06/2017 2:20 PM Indications: pain Details: 27 G 1.5 in needle, posterior approach  Arthrogram: No  Medications: 1 mL lidocaine 1 %; 40 mg triamcinolone acetonide  40 MG/ML Aspirate: 0 mL Outcome: tolerated well, no immediate complications Procedure, treatment alternatives, risks and benefits explained, specific risks discussed. Consent was given by the patient. Immediately prior to procedure a time out was called to verify the correct patient, procedure, equipment, support staff and site/side marked as required. Patient was prepped and draped in the usual sterile fashion.     Allergies: Carrot [daucus carota] and Lactose intolerance (gi)   Assessment / Plan:     Visit Diagnoses: Primary osteoarthritis of both hands-patient has severe arthritis in his hands with DIP PIP thickening.  Joint protection muscle strengthening was discussed.  Pain in both hands -he gives history of MCP discomfort.  Plan: [place] Extrem Up Bilat Comp.  The ultrasound was negative for synovitis.   A handout on head muscle strengthening exercise was given.  Chronic right shoulder pain -patient gives a history of possible partial rotator cuff tear in the past several years ago.  He has been having increased right shoulder joint pain and difficulty doing routine activities.  Plan: XR Shoulder Right.  The x-ray showed high rising humerus otherwise unremarkable.  Per his request right shoulder joint was injected with cortisone the procedure as described above.  If he continues to have discomfort he will notify me.  A handout on shoulder joint exercises was given.  Primary osteoarthritis of both knees-he does have chronic pain.  He has been taking meloxicam Advil and Tylenol.  Side effect of these medications were discussed at length.  I have advised him not to take any Advil when he is taking meloxicam.  Primary osteoarthritis of both feet- proper fitting shoes were discussed.-  DDD (degenerative disc disease), cervical-he has chronic discomfort.  Atrophy of muscle of both hands-he has appointment coming up with neurology.  History of asthma  History of gastroesophageal reflux (GERD)-avoiding NSAIDs was discussed.  Essential hypertension -I have advised him to monitor his blood pressure closely after cortisone injection.   Orders: Orders Placed This Encounter  Procedures  . Large Joint Inj  . [place] Extrem Up Bilat Comp  . XR Shoulder Right   No orders of the defined types were placed in this encounter.   Face-to-face time spent with patient was 40 minutes. >50% of time was spent in counseling and coordination of care.  Follow-Up Instructions: Return in about 6 months (around 02/05/2018) for Osteoarthritis.   Bo Merino, MD  Note - This record has been created using Editor, commissioning.  Chart creation errors have been sought, but may not always  have been located. Such creation errors do not reflect on  the standard of medical care.

## 2017-08-05 DIAGNOSIS — M19071 Primary osteoarthritis, right ankle and foot: Secondary | ICD-10-CM | POA: Insufficient documentation

## 2017-08-05 DIAGNOSIS — Z8719 Personal history of other diseases of the digestive system: Secondary | ICD-10-CM | POA: Insufficient documentation

## 2017-08-05 DIAGNOSIS — B999 Unspecified infectious disease: Secondary | ICD-10-CM

## 2017-08-05 DIAGNOSIS — M62542 Muscle wasting and atrophy, not elsewhere classified, left hand: Secondary | ICD-10-CM

## 2017-08-05 DIAGNOSIS — M170 Bilateral primary osteoarthritis of knee: Secondary | ICD-10-CM | POA: Insufficient documentation

## 2017-08-05 DIAGNOSIS — M19072 Primary osteoarthritis, left ankle and foot: Secondary | ICD-10-CM

## 2017-08-05 DIAGNOSIS — M62541 Muscle wasting and atrophy, not elsewhere classified, right hand: Secondary | ICD-10-CM | POA: Insufficient documentation

## 2017-08-05 DIAGNOSIS — M5030 Other cervical disc degeneration, unspecified cervical region: Secondary | ICD-10-CM

## 2017-08-05 DIAGNOSIS — M19041 Primary osteoarthritis, right hand: Secondary | ICD-10-CM | POA: Insufficient documentation

## 2017-08-05 DIAGNOSIS — M19042 Primary osteoarthritis, left hand: Secondary | ICD-10-CM | POA: Insufficient documentation

## 2017-08-05 DIAGNOSIS — Z8709 Personal history of other diseases of the respiratory system: Secondary | ICD-10-CM | POA: Insufficient documentation

## 2017-08-05 HISTORY — DX: Personal history of other diseases of the digestive system: Z87.19

## 2017-08-05 HISTORY — DX: Primary osteoarthritis, left hand: M19.042

## 2017-08-05 HISTORY — DX: Muscle wasting and atrophy, not elsewhere classified, right hand: M62.541

## 2017-08-05 HISTORY — DX: Other cervical disc degeneration, unspecified cervical region: M50.30

## 2017-08-05 HISTORY — DX: Unspecified infectious disease: B99.9

## 2017-08-05 HISTORY — DX: Muscle wasting and atrophy, not elsewhere classified, left hand: M62.542

## 2017-08-05 HISTORY — DX: Personal history of other diseases of the respiratory system: Z87.09

## 2017-08-05 HISTORY — DX: Primary osteoarthritis, right hand: M19.041

## 2017-08-05 NOTE — Progress Notes (Deleted)
Office Visit Note  Patient: Ernest Sanders             Date of Birth: 1945-03-23           MRN: [id number]             PCP: Ann Held, DO Referring: Ann Held, * Visit Date: 08/12/2017 Occupation: @GUAROCC @    Subjective:  No chief complaint on file.   History of Present Illness: Ernest Sanders is a 72 y.o. male ***   Activities of Daily Living:  Patient reports morning stiffness for *** {minute/hour:19697}.   Patient {ACTIONS;DENIES/REPORTS:21021675::"Denies"} nocturnal pain.  Difficulty dressing/grooming: {ACTIONS;DENIES/REPORTS:21021675::"Denies"} Difficulty climbing stairs: {ACTIONS;DENIES/REPORTS:21021675::"Denies"} Difficulty getting out of chair: {ACTIONS;DENIES/REPORTS:21021675::"Denies"} Difficulty using hands for taps, buttons, cutlery, and/or writing: {ACTIONS;DENIES/REPORTS:21021675::"Denies"}   No Rheumatology ROS completed.   PMFS History:  Patient Active Problem List   Diagnosis Date Noted  . [facility] spotted fever) 11/08/2016  . Bilateral inguinal hernia without obstruction or gangrene 10/21/2015  . HTN (hypertension) 04/27/2012    Past Medical History:  Diagnosis Date  . 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 Anxiety   . Arthritis   . Asthma    slight  . Depression   . Enlarged prostate   . GERD (gastroesophageal reflux disease)   . HTN (hypertension)   . Multiple allergies   . Shortness of breath dyspnea     Family History  Problem Relation Age of Onset  . Heart disease Father        CHF  . Hypertension Mother   . Diabetes Mother        Surgical  . Arthritis Unknown   . Stroke Paternal Grandmother    Past Surgical History:  Procedure Laterality Date  . INGUINAL HERNIA REPAIR Bilateral 11/21/2015   Procedure: OPEN BILATERAL INGUINAL HERNIA REPAIR;  Surgeon: [name], MD;  Location: [place];  Service: General;  Laterality: Bilateral;  . INSERTION OF MESH Bilateral 11/21/2015   Procedure:  INSERTION OF MESH;  Surgeon: [name], MD;  Location: [place];  Service: General;  Laterality: Bilateral;  . LIPOMA EXCISION N/A 11/21/2015   Procedure: EXCISION TWO ABDOMINAL WALL LIPOMAS;  Surgeon: [name], MD;  Location: [place];  Service: General;  Laterality: N/A;  . UMBILICAL HERNIA REPAIR  2002   Social History   Social History Narrative  . Not on file     Objective: Vital Signs: There were no vitals taken for this visit.   Physical Exam   Musculoskeletal Exam: ***  CDAI Exam: No CDAI exam completed.    Investigation: No additional findings. June 25, 2017 CK normal, TSH normal, uric acid 7.1, ACE 62, anti-CCP negative, Borrelia IgG and IgM index negative  Imaging: No results found.  Speciality Comments: No specialty comments available.    Procedures:  No procedures performed Allergies: Carrot [daucus carota] and Lactose intolerance (gi)   Assessment / Plan:     Visit Diagnoses: No diagnosis found.    Orders: No orders of the defined types were placed in this encounter.  No orders of the defined types were placed in this encounter.   Face-to-face time spent with patient was *** minutes. 50% of time was spent in counseling and coordination of care.  Follow-Up Instructions: No follow-ups on file.   Bo Merino, MD  Note - This record has been created using Editor, commissioning.  Chart creation errors have been sought, but may not always  have been  located. Such creation errors do not reflect on  the standard of medical care.

## 2017-08-06 ENCOUNTER — Ambulatory Visit (INDEPENDENT_AMBULATORY_CARE_PROVIDER_SITE_OTHER): Payer: Self-pay

## 2017-08-06 ENCOUNTER — Encounter: Payer: Self-pay | Admitting: Rheumatology

## 2017-08-06 ENCOUNTER — Ambulatory Visit: Payer: Federal, State, Local not specified - PPO | Admitting: Rheumatology

## 2017-08-06 VITALS — BP 142/80 | HR 68 | Resp 13 | Ht 70.0 in | Wt 165.0 lb

## 2017-08-06 DIAGNOSIS — M62541 Muscle wasting and atrophy, not elsewhere classified, right hand: Secondary | ICD-10-CM

## 2017-08-06 DIAGNOSIS — M79641 Pain in right hand: Secondary | ICD-10-CM | POA: Diagnosis not present

## 2017-08-06 DIAGNOSIS — M170 Bilateral primary osteoarthritis of knee: Secondary | ICD-10-CM | POA: Diagnosis not present

## 2017-08-06 DIAGNOSIS — M19042 Primary osteoarthritis, left hand: Secondary | ICD-10-CM

## 2017-08-06 DIAGNOSIS — M62542 Muscle wasting and atrophy, not elsewhere classified, left hand: Secondary | ICD-10-CM

## 2017-08-06 DIAGNOSIS — M5030 Other cervical disc degeneration, unspecified cervical region: Secondary | ICD-10-CM | POA: Diagnosis not present

## 2017-08-06 DIAGNOSIS — M79642 Pain in left hand: Secondary | ICD-10-CM

## 2017-08-06 DIAGNOSIS — M19071 Primary osteoarthritis, right ankle and foot: Secondary | ICD-10-CM

## 2017-08-06 DIAGNOSIS — M19072 Primary osteoarthritis, left ankle and foot: Secondary | ICD-10-CM | POA: Diagnosis not present

## 2017-08-06 DIAGNOSIS — Z8709 Personal history of other diseases of the respiratory system: Secondary | ICD-10-CM | POA: Diagnosis not present

## 2017-08-06 DIAGNOSIS — Z8719 Personal history of other diseases of the digestive system: Secondary | ICD-10-CM

## 2017-08-06 DIAGNOSIS — M25511 Pain in right shoulder: Secondary | ICD-10-CM | POA: Diagnosis not present

## 2017-08-06 DIAGNOSIS — M19041 Primary osteoarthritis, right hand: Secondary | ICD-10-CM

## 2017-08-06 DIAGNOSIS — I10 Essential (primary) hypertension: Secondary | ICD-10-CM

## 2017-08-06 DIAGNOSIS — G8929 Other chronic pain: Secondary | ICD-10-CM

## 2017-08-06 MED ORDER — TRIAMCINOLONE ACETONIDE 40 MG/ML IJ SUSP
40.0000 mg | INTRAMUSCULAR | Status: AC | PRN
  Administered 2017-08-06: 40 mg via INTRA_ARTICULAR

## 2017-08-06 MED ORDER — LIDOCAINE HCL 1 % IJ SOLN
1.0000 mL | INTRAMUSCULAR | Status: AC | PRN
  Administered 2017-08-06: 1 mL

## 2017-08-06 NOTE — Patient Instructions (Signed)
Hand Exercises Hand exercises can be helpful to almost anyone. These exercises can strengthen the hands, improve flexibility and movement, and increase blood flow to the hands. These results can make work and daily tasks easier. Hand exercises can be especially helpful for people who have joint pain from arthritis or have nerve damage from overuse (carpal tunnel syndrome). These exercises can also help people who have injured a hand. Most of these hand exercises are fairly gentle stretching routines. You can do them often throughout the day. Still, it is a good idea to ask your health care provider which exercises would be best for you. Warming your hands before exercise may help to reduce stiffness. You can do this with gentle massage or by placing your hands in warm water for 15 minutes. Also, make sure you pay attention to your level of hand pain as you begin an exercise routine. Exercises Knuckle Bend Repeat this exercise 5-10 times with each hand. 1. Stand or sit with your arm, hand, and all five fingers pointed straight up. Make sure your wrist is straight. 2. Gently and slowly bend your fingers down and inward until the tips of your fingers are touching the tops of your palm. 3. Hold this position for a few seconds. 4. Extend your fingers out to their original position, all pointing straight up again.  Finger Fan Repeat this exercise 5-10 times with each hand. 1. Hold your arm and hand out in front of you. Keep your wrist straight. 2. Squeeze your hand into a fist. 3. Hold this position for a few seconds. 4. Fan out, or spread apart, your hand and fingers as much as possible, stretching every joint fully.  Tabletop Repeat this exercise 5-10 times with each hand. 1. Stand or sit with your arm, hand, and all five fingers pointed straight up. Make sure your wrist is straight. 2. Gently and slowly bend your fingers at the knuckles where they meet the hand until your hand is making an  upside-down L shape. Your fingers should form a tabletop. 3. Hold this position for a few seconds. 4. Extend your fingers out to their original position, all pointing straight up again.  Making Os Repeat this exercise 5-10 times with each hand. 1. Stand or sit with your arm, hand, and all five fingers pointed straight up. Make sure your wrist is straight. 2. Make an O shape by touching your pointer finger to your thumb. Hold for a few seconds. Then open your hand wide. 3. Repeat this motion with each finger on your hand.  Table Spread Repeat this exercise 5-10 times with each hand. 1. Place your hand on a table with your palm facing down. Make sure your wrist is straight. 2. Spread your fingers out as much as possible. Hold this position for a few seconds. 3. Slide your fingers back together again. Hold for a few seconds.  Ball Grip  Repeat this exercise 10-15 times with each hand. 1. Hold a tennis ball or another soft ball in your hand. 2. While slowly increasing pressure, squeeze the ball as hard as possible. 3. Squeeze as hard as you can for 3-5 seconds. 4. Relax and repeat.  Wrist Curls Repeat this exercise 10-15 times with each hand. 1. Sit in a chair that has armrests. 2. Hold a light weight in your hand, such as a dumbbell that weighs 1-3 pounds (0.5-1.4 kg). Ask your health care provider what weight would be best for you. 3. Rest your hand just over   the end of the chair arm with your palm facing up. 4. Gently pivot your wrist up and down while holding the weight. Do not twist your wrist from side to side.  Contact a health care provider if:  Your hand pain or discomfort gets much worse when you do an exercise.  Your hand pain or discomfort does not improve within 2 hours after you exercise. If you have any of these problems, stop doing these exercises right away. Do not do them again unless your health care provider says that you can. Get help right away if:  You  develop sudden, severe hand pain. If this happens, stop doing these exercises right away. Do not do them again unless your health care provider says that you can. This information is not intended to replace advice given to you by your health care provider. Make sure you discuss any questions you have with your health care provider. Document Released: 01/30/2015 Document Revised: 07/27/2015 Document Reviewed: 08/29/2014 Elsevier Interactive Patient Education  2018 [employer] Shoulder Exercises Ask your health care provider which exercises are safe for you. Do exercises exactly as told by your health care provider and adjust them as directed. It is normal to feel mild stretching, pulling, tightness, or discomfort as you do these exercises, but you should stop right away if you feel sudden pain or your pain gets worse.Do not begin these exercises until told by your health care provider. RANGE OF MOTION EXERCISES These exercises warm up your muscles and joints and improve the movement and flexibility of your shoulder. These exercises also help to relieve pain, numbness, and tingling. These exercises involve stretching your injured shoulder directly. Exercise A: Pendulum  1. Stand near a wall or a surface that you can hold onto for balance. 2. Bend at the waist and let your left / right arm hang straight down. Use your other arm to support you. Keep your back straight and do not lock your knees. 3. Relax your left / right arm and shoulder muscles, and move your hips and your trunk so your left / right arm swings freely. Your arm should swing because of the motion of your body, not because you are using your arm or shoulder muscles. 4. Keep moving your body so your arm swings in the following directions, as told by your health care provider: ? Side to side. ? Forward and backward. ? In clockwise and counterclockwise circles. 5. Continue each motion for __________ seconds, or for as long as told by  your health care provider. 6. Slowly return to the starting position. Repeat __________ times. Complete this exercise __________ times a day. Exercise B:Flexion, Standing  1. Stand and hold a broomstick, a cane, or a similar object. Place your hands a little more than shoulder-width apart on the object. Your left / right hand should be palm-up, and your other hand should be palm-down. 2. Keep your elbow straight and keep your shoulder muscles relaxed. Push the stick down with your healthy arm to raise your left / right arm in front of your body, and then over your head until you feel a stretch in your shoulder. ? Avoid shrugging your shoulder while you raise your arm. Keep your shoulder blade tucked down toward the middle of your back. 3. Hold for __________ seconds. 4. Slowly return to the starting position. Repeat __________ times. Complete this exercise __________ times a day. Exercise C: Abduction, Standing 1. Stand and hold a broomstick, a cane, or a similar   object. Place your hands a little more than shoulder-width apart on the object. Your left / right hand should be palm-up, and your other hand should be palm-down. 2. While keeping your elbow straight and your shoulder muscles relaxed, push the stick across your body toward your left / right side. Raise your left / right arm to the side of your body and then over your head until you feel a stretch in your shoulder. ? Do not raise your arm above shoulder height, unless your health care provider tells you to do that. ? Avoid shrugging your shoulder while you raise your arm. Keep your shoulder blade tucked down toward the middle of your back. 3. Hold for __________ seconds. 4. Slowly return to the starting position. Repeat __________ times. Complete this exercise __________ times a day. Exercise D:Internal Rotation  1. Place your left / right hand behind your back, palm-up. 2. Use your other hand to dangle an exercise band, a towel, or a  similar object over your shoulder. Grasp the band with your left / right hand so you are holding onto both ends. 3. Gently pull up on the band until you feel a stretch in the front of your left / right shoulder. ? Avoid shrugging your shoulder while you raise your arm. Keep your shoulder blade tucked down toward the middle of your back. 4. Hold for __________ seconds. 5. Release the stretch by letting go of the band and lowering your hands. Repeat __________ times. Complete this exercise __________ times a day. STRETCHING EXERCISES These exercises warm up your muscles and joints and improve the movement and flexibility of your shoulder. These exercises also help to relieve pain, numbness, and tingling. These exercises are done using your healthy shoulder to help stretch the muscles of your injured shoulder. Exercise E: Corner Stretch (External Rotation and Abduction)  1. Stand in a doorway with one of your feet slightly in front of the other. This is called a staggered stance. If you cannot reach your forearms to the door frame, stand facing a corner of a room. 2. Choose one of the following positions as told by your health care provider: ? Place your hands and forearms on the door frame above your head. ? Place your hands and forearms on the door frame at the height of your head. ? Place your hands on the door frame at the height of your elbows. 3. Slowly move your weight onto your front foot until you feel a stretch across your chest and in the front of your shoulders. Keep your head and chest upright and keep your abdominal muscles tight. 4. Hold for __________ seconds. 5. To release the stretch, shift your weight to your back foot. Repeat __________ times. Complete this stretch __________ times a day. Exercise F:Extension, Standing 1. Stand and hold a broomstick, a cane, or a similar object behind your back. ? Your hands should be a little wider than shoulder-width apart. ? Your palms  should face away from your back. 2. Keeping your elbows straight and keeping your shoulder muscles relaxed, move the stick away from your body until you feel a stretch in your shoulder. ? Avoid shrugging your shoulders while you move the stick. Keep your shoulder blade tucked down toward the middle of your back. 3. Hold for __________ seconds. 4. Slowly return to the starting position. Repeat __________ times. Complete this exercise __________ times a day. STRENGTHENING EXERCISES These exercises build strength and endurance in your shoulder. Endurance is the ability   to use your muscles for a long time, even after they get tired. Exercise G:External Rotation  1. Sit in a stable chair without armrests. 2. Secure an exercise band at elbow height on your left / right side. 3. Place a soft object, such as a folded towel or a small pillow, between your left / right upper arm and your body to move your elbow a few inches away (about 10 cm) from your side. 4. Hold the end of the band so it is tight and there is no slack. 5. Keeping your elbow pressed against the soft object, move your left / right forearm out, away from your abdomen. Keep your body steady so only your forearm moves. 6. Hold for __________ seconds. 7. Slowly return to the starting position. Repeat __________ times. Complete this exercise __________ times a day. Exercise H:Shoulder Abduction  1. Sit in a stable chair without armrests, or stand. 2. Hold a __________ weight in your left / right hand, or hold an exercise band with both hands. 3. Start with your arms straight down and your left / right palm facing in, toward your body. 4. Slowly lift your left / right hand out to your side. Do not lift your hand above shoulder height unless your health care provider tells you that this is safe. ? Keep your arms straight. ? Avoid shrugging your shoulder while you do this movement. Keep your shoulder blade tucked down toward the middle of  your back. 5. Hold for __________ seconds. 6. Slowly lower your arm, and return to the starting position. Repeat __________ times. Complete this exercise __________ times a day. Exercise I:Shoulder Extension 1. Sit in a stable chair without armrests, or stand. 2. Secure an exercise band to a stable object in front of you where it is at shoulder height. 3. Hold one end of the exercise band in each hand. Your palms should face each other. 4. Straighten your elbows and lift your hands up to shoulder height. 5. Step back, away from the secured end of the exercise band, until the band is tight and there is no slack. 6. Squeeze your shoulder blades together as you pull your hands down to the sides of your thighs. Stop when your hands are straight down by your sides. Do not let your hands go behind your body. 7. Hold for __________ seconds. 8. Slowly return to the starting position. Repeat __________ times. Complete this exercise __________ times a day. Exercise J:Standing Shoulder Row 1. Sit in a stable chair without armrests, or stand. 2. Secure an exercise band to a stable object in front of you so it is at waist height. 3. Hold one end of the exercise band in each hand. Your palms should be in a thumbs-up position. 4. Bend each of your elbows to an "L" shape (about 90 degrees) and keep your upper arms at your sides. 5. Step back until the band is tight and there is no slack. 6. Slowly pull your elbows back behind you. 7. Hold for __________ seconds. 8. Slowly return to the starting position. Repeat __________ times. Complete this exercise __________ times a day. Exercise K:Shoulder Press-Ups  1. Sit in a stable chair that has armrests. Sit upright, with your feet flat on the floor. 2. Put your hands on the armrests so your elbows are bent and your fingers are pointing forward. Your hands should be about even with the sides of your body. 3. Push down on the armrests and use your arms   to  lift yourself off of the chair. Straighten your elbows and lift yourself up as much as you comfortably can. ? Move your shoulder blades down, and avoid letting your shoulders move up toward your ears. ? Keep your feet on the ground. As you get stronger, your feet should support less of your body weight as you lift yourself up. 4. Hold for __________ seconds. 5. Slowly lower yourself back into the chair. Repeat __________ times. Complete this exercise __________ times a day. Exercise L: Wall Push-Ups  1. Stand so you are facing a stable wall. Your feet should be about one arm-length away from the wall. 2. Lean forward and place your palms on the wall at shoulder height. 3. Keep your feet flat on the floor as you bend your elbows and lean forward toward the wall. 4. Hold for __________ seconds. 5. Straighten your elbows to push yourself back to the starting position. Repeat __________ times. Complete this exercise __________ times a day. This information is not intended to replace advice given to you by your health care provider. Make sure you discuss any questions you have with your health care provider. Document Released: 01/02/2005 Document Revised: 11/13/2015 Document Reviewed: 10/30/2014 Elsevier Interactive Patient Education  2018 [employer]  

## 2017-08-08 ENCOUNTER — Other Ambulatory Visit: Payer: Self-pay | Admitting: Family Medicine

## 2017-08-08 DIAGNOSIS — M2550 Pain in unspecified joint: Secondary | ICD-10-CM

## 2017-08-12 ENCOUNTER — Ambulatory Visit: Payer: Federal, State, Local not specified - PPO | Admitting: Rheumatology

## 2017-08-19 ENCOUNTER — Ambulatory Visit (INDEPENDENT_AMBULATORY_CARE_PROVIDER_SITE_OTHER): Payer: Federal, State, Local not specified - PPO | Admitting: Orthopaedic Surgery

## 2017-08-19 ENCOUNTER — Encounter (INDEPENDENT_AMBULATORY_CARE_PROVIDER_SITE_OTHER): Payer: Self-pay | Admitting: Orthopaedic Surgery

## 2017-08-19 VITALS — BP 136/71 | HR 76 | Resp 18 | Ht 70.0 in | Wt 160.0 lb

## 2017-08-19 DIAGNOSIS — M79671 Pain in right foot: Secondary | ICD-10-CM

## 2017-08-19 NOTE — Progress Notes (Signed)
Office Visit Note   Patient: Ernest Sanders           Date of Birth: 02-Jun-1945           MRN: [id number] Visit Date: 08/19/2017              Requested by: [address] [place], [place] [address] [place], [place] [address] PCP: [name], [name], DO   Assessment & Plan: Visit Diagnoses:  1. Right foot pain     Plan: Painful right second and third toes hypertrophic changes at the PIP joint second toe degenerative changes at the second metatarsal phalangeal joint.  The second toe rubs against the third creating a small ulcer.  I will refer to Dr. Sharol Given.  I think there are several options in terms of his second toe that would include a debulking of the PIP joint and possible fusion to prevent the second toe from rubbing on the third.  He will also need a procedure on the metatarsal phalangeal joint as it so uncomfortable  Follow-Up Instructions: Return refer to Dr Sharol Given.   Orders:  No orders of the defined types were placed in this encounter.  No orders of the defined types were placed in this encounter.     Procedures: No procedures performed   Clinical Data: No additional findings.   Subjective: Chief Complaint  Patient presents with  . Right Foot - Pain  . Toe Pain    2nd and 3rd toes, right foot x 7 years, Dr. Paulla Dolly, injury kicked vaccum cleaner, spur 2nd toe irritating 3rd toe, difficulty walking, tender, difficulty sleeping at night, radiating, comes and goes, like "electricity", Tylenol and Meloxicam helps, Dr. Estanislado Pandy patient  Ernest Sanders in the office today for evaluation of her chronic problem with his right foot.  Many years ago he kicked a "vacuum cleaner".  Has been having some trouble with his right second and third toes since that time.  He has been seen by a local podiatrist who had suggested "surgery".  Ernest Sanders basically would like another opinion.  He would rather have a more definitive procedure rather than temporary  treatment. I did review films of his right foot on the PACS system there are obvious degenerative changes at the metatarsal phalangeal joint of the second toe with subchondral cyst formation and some hypertrophy of the second metatarsal head.  The joint space is somewhat narrowed.  There was some prominence of the lateral condyle proximal phalanx at the PIP joint of the second toe.  There could be some evidence of avascular necrosis of the second metatarsal head  HPI  Review of Systems  Constitutional: Negative for activity change.  HENT: Positive for trouble swallowing.   Eyes: Positive for pain.  Respiratory: Negative for shortness of breath.   Cardiovascular: Positive for chest pain and leg swelling.  Gastrointestinal: Positive for diarrhea.  Endocrine: Negative for cold intolerance.  Genitourinary: Positive for frequency.  Musculoskeletal: Positive for joint swelling.  Skin: Negative for rash.  Allergic/Immunologic: Positive for food allergies.  Neurological: Positive for numbness.  Hematological: Bruises/bleeds easily.  Psychiatric/Behavioral: Positive for sleep disturbance.     Objective: Vital Signs: BP 136/71 (BP Location: Left Arm, Patient Position: Sitting, Cuff Size: Normal)   Pulse 76   Resp 18   Ht 5\' 10"  (1.778 m)   Wt 160 lb (72.6 kg)   BMI 22.96 kg/m   Physical Exam  Constitutional: He is oriented to person, place, and  time. He appears well-developed and well-nourished.  HENT:  Mouth/Throat: Oropharynx is clear and moist.  Eyes: Pupils are equal, round, and reactive to light. EOM are normal.  Pulmonary/Chest: Effort normal.  Neurological: He is alert and oriented to person, place, and time.  Skin: Skin is warm and dry.  Psychiatric: He has a normal mood and affect. His behavior is normal.    Ortho Exam awake alert and oriented x3.  Comfortable sitting.  Exam was limited to the right foot.  There was some hypertrophic changes about the PIP joint.  There was a  callus on the lateral aspect of the PIP joint that was rubbing against the third toe causing a very small ulcer it was superficial and noninfected.  He is wearing a toe sleeve on the third toe.  He had limited range of motion of both the PIP joint and the metatarsal phalangeal joint of the second toe.  There are some hypertrophic changes about the metatarsal phalangeal joint as well.  Good capillary refill.  Normal sensibility  Specialty Comments:  No specialty comments available.  Imaging: No results found.   PMFS History: Patient Active Problem List   Diagnosis Date Noted  . Primary osteoarthritis of both hands 08/05/2017  . Primary osteoarthritis of both knees 08/05/2017  . Primary osteoarthritis of both feet 08/05/2017  . DDD (degenerative disc disease), cervical 08/05/2017  . Atrophy of muscle of both hands 08/05/2017  . History of gastroesophageal reflux (GERD) 08/05/2017  . History of asthma 08/05/2017  . Recurrent infections 08/05/2017  . [facility] spotted fever) 11/08/2016  . Bilateral inguinal hernia without obstruction or gangrene 10/21/2015  . HTN (hypertension) 04/27/2012   Past Medical History:  Diagnosis Date  . 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 Anxiety   . Arthritis   . Asthma    slight  . Depression   . Enlarged prostate   . GERD (gastroesophageal reflux disease)   . HTN (hypertension)   . Multiple allergies   . Shortness of breath dyspnea     Family History  Problem Relation Age of Onset  . Heart disease Father        CHF  . Hypertension Mother   . Diabetes Mother        Surgical  . Arthritis Unknown   . Stroke Paternal Grandmother     Past Surgical History:  Procedure Laterality Date  . INGUINAL HERNIA REPAIR Bilateral 11/21/2015   Procedure: OPEN BILATERAL INGUINAL HERNIA REPAIR;  Surgeon: [name], MD;  Location: [facility];  Service: General;  Laterality: Bilateral;  . INSERTION OF MESH Bilateral 11/21/2015   Procedure:  INSERTION OF MESH;  Surgeon: [name], MD;  Location: [facility];  Service: General;  Laterality: Bilateral;  . LIPOMA EXCISION N/A 11/21/2015   Procedure: EXCISION TWO ABDOMINAL WALL LIPOMAS;  Surgeon: [name], MD;  Location: [facility];  Service: General;  Laterality: N/A;  . UMBILICAL HERNIA REPAIR  2002   Social History   Occupational History  . Not on file  Tobacco Use  . Smoking status: Former Smoker    Types: Pipe, Cigars    Last attempt to quit: 1984    Years since quitting: 35.4  . Smokeless tobacco: Never Used  Substance and Sexual Activity  . Alcohol use: No    Comment: Recovering  . Drug use: No  . Sexual activity: Not Currently

## 2017-08-29 ENCOUNTER — Ambulatory Visit (INDEPENDENT_AMBULATORY_CARE_PROVIDER_SITE_OTHER): Payer: Federal, State, Local not specified - PPO | Admitting: Orthopedic Surgery

## 2017-08-29 ENCOUNTER — Encounter (INDEPENDENT_AMBULATORY_CARE_PROVIDER_SITE_OTHER): Payer: Self-pay | Admitting: Orthopedic Surgery

## 2017-08-29 VITALS — Ht 70.0 in | Wt 160.0 lb

## 2017-08-29 DIAGNOSIS — M205X1 Other deformities of toe(s) (acquired), right foot: Secondary | ICD-10-CM

## 2017-08-29 DIAGNOSIS — M6701 Short Achilles tendon (acquired), right ankle: Secondary | ICD-10-CM

## 2017-08-29 NOTE — Progress Notes (Signed)
Office Visit Note   Patient: Ernest Sanders           Date of Birth: [birth year]           MRN: [id number] Visit Date: 08/29/2017              Requested by: [address], [place], [place] [address] [place], [place] [address] PCP: [name], [name], DO  Chief Complaint  Patient presents with  . Right Foot - Pain      HPI: Patient is a 72 year old gentleman who presents with forefoot burning pain and tingling which is worse with prolonged activities he just mowed his yard he states the pain was worse after doing that.  Assessment & Plan: Visit Diagnoses:  1. Acquired claw toe, right   2. Achilles tendon contracture, right     Plan: Patient was given instructions for Achilles stretching to do 5 times a day a minute at a time recommended a stiff soled Trail running sneaker to unload the forefoot and over-the-counter orthotics with a firm arch support.  Follow-up as needed.  Discussed that if he fails conservative treatment surgical option would include Weil osteotomy as well as gastrocnemius recession.  Follow-Up Instructions: Return if symptoms worsen or fail to improve.   Ortho Exam  Patient is alert, oriented, no adenopathy, well-dressed, normal affect, normal respiratory effort. Examination patient has a good dorsalis pedis pulse he has dorsiflexion about 20 degrees short of neutral with his knee extended he has swelling around the metatarsal heads with flexible clawing of the toes he is tender to palpation beneath the metatarsal heads.  Imaging: No results found. No images are attached to the encounter.  Labs: Lab Results  Component Value Date   ESRSEDRATE 11 05/21/2017   LABURIC 7.1 06/25/2017     Lab Results  Component Value Date   ALBUMIN 4.4 05/21/2017   ALBUMIN 4.4 10/10/2016   ALBUMIN 3.9 11/14/2015   LABURIC 7.1 06/25/2017    Body mass index is 22.96 kg/m.  Orders:  No orders of the defined types were placed in this  encounter.  No orders of the defined types were placed in this encounter.    Procedures: No procedures performed  Clinical Data: No additional findings.  ROS:  All other systems negative, except as noted in the HPI. Review of Systems  Objective: Vital Signs: Ht 5\' 10"  (1.778 m)   Wt 160 lb (72.6 kg)   BMI 22.96 kg/m   Specialty Comments:  No specialty comments available.  PMFS History: Patient Active Problem List   Diagnosis Date Noted  . Primary osteoarthritis of both hands 08/05/2017  . Primary osteoarthritis of both knees 08/05/2017  . Primary osteoarthritis of both feet 08/05/2017  . DDD (degenerative disc disease), cervical 08/05/2017  . Atrophy of muscle of both hands 08/05/2017  . History of gastroesophageal reflux (GERD) 08/05/2017  . History of asthma 08/05/2017  . Recurrent infections 08/05/2017  . RMSF [facility] spotted fever) 11/08/2016  . Bilateral inguinal hernia without obstruction or gangrene 10/21/2015  . HTN (hypertension) 04/27/2012   Past Medical History:  Diagnosis Date  . 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 Anxiety   . Arthritis   . Asthma    slight  . Depression   . Enlarged prostate   . GERD (gastroesophageal reflux disease)   . HTN (hypertension)   . Multiple allergies   . Shortness of breath dyspnea  Family History  Problem Relation Age of Onset  . Heart disease Father        CHF  . Hypertension Mother   . Diabetes Mother        Surgical  . Arthritis Unknown   . Stroke Paternal Grandmother     Past Surgical History:  Procedure Laterality Date  . INGUINAL HERNIA REPAIR Bilateral 11/21/2015   Procedure: OPEN BILATERAL INGUINAL HERNIA REPAIR;  Surgeon: [name], MD;  Location: [place];  Service: General;  Laterality: Bilateral;  . INSERTION OF MESH Bilateral 11/21/2015   Procedure: INSERTION OF MESH;  Surgeon: [name], MD;  Location: [place];  Service: General;  Laterality: Bilateral;  . LIPOMA EXCISION  N/A 11/21/2015   Procedure: EXCISION TWO ABDOMINAL WALL LIPOMAS;  Surgeon: [name], MD;  Location: [place];  Service: General;  Laterality: N/A;  . UMBILICAL HERNIA REPAIR  2002   Social History   Occupational History  . Not on file  Tobacco Use  . Smoking status: Former Smoker    Types: Pipe, Cigars    Last attempt to quit: 1984    Years since quitting: 35.5  . Smokeless tobacco: Never Used  Substance and Sexual Activity  . Alcohol use: No    Comment: Recovering  . Drug use: No  . Sexual activity: Not Currently

## 2017-09-15 ENCOUNTER — Other Ambulatory Visit: Payer: Self-pay | Admitting: Family Medicine

## 2017-09-15 DIAGNOSIS — M2550 Pain in unspecified joint: Secondary | ICD-10-CM

## 2017-09-15 NOTE — Telephone Encounter (Signed)
meloxicam refill Last Refill:08/08/17 # 30 Last OV: 05/20/17 PCP: Dr [name] Pharmacy:[facility] [address]

## 2017-09-15 NOTE — Telephone Encounter (Signed)
Copied from [place] [id number]. Topic: Quick Communication - Rx Refill/Question >> Sep 15, 2017  4:27 PM Ernest Sanders wrote: Medication: meloxicam (MOBIC) 15 MG tablet [[id number]]   Has the patient contacted their pharmacy? No  (Agent: If no, request that the patient contact the pharmacy for the refill.) (Agent: If yes, when and what did the pharmacy advise?)  Preferred Pharmacy (with phone number or street name): [facility].  Pt is requesting more then just Sanders 30 day supply of this med.  He would like to take it everyday for it to help.  He would like to know if Sanders 90 day supply can be called in ?    Agent: Please be advised that RX refills may take up to 3 business days. We ask that you follow-up with your pharmacy.

## 2017-10-13 ENCOUNTER — Other Ambulatory Visit: Payer: Self-pay | Admitting: Family Medicine

## 2017-10-13 DIAGNOSIS — M2550 Pain in unspecified joint: Secondary | ICD-10-CM

## 2017-10-13 NOTE — Telephone Encounter (Signed)
Pt would like refills on generic mobic. Pt does not want to call every month for refills

## 2017-10-17 ENCOUNTER — Ambulatory Visit: Payer: Federal, State, Local not specified - PPO | Admitting: Neurology

## 2017-10-17 ENCOUNTER — Encounter: Payer: Self-pay | Admitting: Neurology

## 2017-10-17 VITALS — BP 148/90 | HR 86 | Ht 70.0 in | Wt 162.1 lb

## 2017-10-17 DIAGNOSIS — M5412 Radiculopathy, cervical region: Secondary | ICD-10-CM | POA: Diagnosis not present

## 2017-10-17 DIAGNOSIS — M6258 Muscle wasting and atrophy, not elsewhere classified, other site: Secondary | ICD-10-CM | POA: Diagnosis not present

## 2017-10-17 NOTE — Patient Instructions (Addendum)
MRI cervical spine without contrast  We will call you with the results 

## 2017-10-17 NOTE — Progress Notes (Signed)
[facility] Clinic Note - Initial Visit   Date: 10/17/17  Azrael Maddix Attwood MRN: [id number] DOB: [birth year]   Dear Dr. Estanislado Pandy:  Thank you for your kind referral of Daylan Juhnke Grotz for consultation of muscle atrophy. Although his history is well known to you, please allow [place] to reiterate it for the purpose of our medical record. The patient was accompanied to the clinic by self.    History of Present Illness: STAVROS CAIL is a 72 y.o. right-handed Caucasian male with osteoarthritis, anxiety, and benzodiazepine abuse undergoing detox presenting for evaluation of bilateral hand atrophy.   He established care with Dr. Estanislado Pandy in April for OA.  She noted severe right hand muscle atrophy and referred him for be evaluated.  Patient has noticed muscle wasting of the right first web space for > 20 years.  He saw a chiropractor who suggested it was stemming from his neck.  He has progressive weakness of the right hand. He denies numbness or tingling of the hands.  He has difficulty with fine motor movements such as manipulating his keys, opening bottles/jars, etc.  He is able to hold utensils and write and has adjusted to using his left hand more.  He has chronic neck pain and has been seeing a chiropractor since the age of 72.  He denies radicular pain into the arms.   He self-admits to having addiction problems with benzodiazepine which he was taking through the [place] for anxiety and is currently going through a detox program.   Out-side paper records, electronic medical record, and images have been reviewed where available and summarized as:  XR cervical spine 10/10/2015:  No convincing evidence of fracture or traumatic malalignment. Evaluation for subtle abnormalities is limited due to severe degenerative changes. Minimal anterior listhesis of C4 versus C5 is essentially stable.  Lab Results  Component Value Date   CKTOTAL 97 06/25/2017   Lab Results    Component Value Date   ESRSEDRATE 11 05/21/2017    Past Medical History:  Diagnosis Date  . Alcohol abuse    Sober since 01/07/70  . Anxiety   . Arthritis   . Asthma    slight  . Depression   . Enlarged prostate   . GERD (gastroesophageal reflux disease)   . HTN (hypertension)   . Multiple allergies   . Shortness of breath dyspnea     Past Surgical History:  Procedure Laterality Date  . INGUINAL HERNIA REPAIR Bilateral 11/21/2015   Procedure: OPEN BILATERAL INGUINAL HERNIA REPAIR;  Surgeon: [name], MD;  Location: [place];  Service: General;  Laterality: Bilateral;  . INSERTION OF MESH Bilateral 11/21/2015   Procedure: INSERTION OF MESH;  Surgeon: [name], MD;  Location: [facility];  Service: General;  Laterality: Bilateral;  . LIPOMA EXCISION N/A 11/21/2015   Procedure: EXCISION TWO ABDOMINAL WALL LIPOMAS;  Surgeon: [name], MD;  Location: [place];  Service: General;  Laterality: N/A;  . UMBILICAL HERNIA REPAIR  2002     Medications:  Outpatient Encounter Medications as of 10/17/2017  Medication Sig  . acetaminophen (TYLENOL) 650 MG CR tablet Take 1,300 mg by mouth daily.  . B Complex Vitamins (B COMPLEX 100 PO) Take 1 tablet by mouth daily.  . cetirizine (ZYRTEC) 10 MG tablet Take 10 mg by mouth daily.  . chlordiazePOXIDE (LIBRIUM) 5 MG capsule TK ONE C PO TID PRN  . Collagen 500 MG CAPS Take 2 capsules by mouth daily.  Marland Kitchen EPINEPHrine 0.3 mg/0.3  mL IJ SOAJ injection Inject 0.3 mLs into the muscle as needed (allergic reaction).   . fluticasone (FLONASE) 50 MCG/ACT nasal spray SHAKE LQ AND U 1 TO 2 SPRAYS IEN QD  . ibuprofen (ADVIL,MOTRIN) 400 MG tablet Take 400 mg by mouth daily.  . meloxicam (MOBIC) 15 MG tablet Take 0.5-1 tablets (7.5-15 mg total) by mouth daily as needed for pain.  . Multiple Vitamin (MULTIVITAMIN) tablet Take 1 tablet by mouth daily.  . TURMERIC PO Take 1 tablet by mouth daily.  . [DISCONTINUED] diazepam (VALIUM) 5 MG tablet Take 2.5 mg by  mouth every morning.    No facility-administered encounter medications on file as of 10/17/2017.      Allergies:  Allergies  Allergen Reactions  . 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 Lactose Intolerance (Gi) Diarrhea    Bloating,gas    Family History: Family History  Problem Relation Age of Onset  . Heart disease Father        CHF  . Hypertension Mother   . Diabetes Mother        Surgical  . Arthritis Unknown   . Stroke Paternal Grandmother     Social History: Social History   Tobacco Use  . Smoking status: Former Smoker    Types: Pipe, Cigars    Last attempt to quit: 1984    Years since quitting: 35.6  . Smokeless tobacco: Never Used  Substance Use Topics  . Alcohol use: No    Comment: Recovering, November 1971  . Drug use: No   Social History   Social History Narrative   Lives alone in a one story home.  No children.  Retired Scientist, research (medical).  Education: some college.     Review of Systems:  CONSTITUTIONAL: No fevers, chills, night sweats, or weight loss.   EYES: No visual changes or eye pain ENT: No hearing changes.  No history of nose bleeds.   RESPIRATORY: No cough, wheezing and shortness of breath.   CARDIOVASCULAR: Negative for chest pain, and palpitations.   GI: Negative for abdominal discomfort, blood in stools or black stools.  No recent change in bowel habits.   GU:  No history of incontinence.   MUSCLOSKELETAL: +history of joint pain or swelling.  No myalgias.   SKIN: Negative for lesions, rash, and itching.   HEMATOLOGY/ONCOLOGY: Negative for prolonged bleeding, bruising easily, and swollen nodes.  No history of cancer.   ENDOCRINE: Negative for cold or heat intolerance, polydipsia or goiter.   PSYCH:  +depression +anxiety symptoms.   NEURO: As Above.   Vital Signs:  BP (!) 148/90   Pulse 86   Ht 5\' 10"  (1.778 m)   Wt 162 lb 2 oz (73.5 kg)   SpO2 92%   BMI 23.26 kg/m    General Medical Exam:   General:  Well appearing,  comfortable.   Eyes/ENT: see cranial nerve examination.   Neck: No masses appreciated.  Full range of motion without tenderness.  No carotid bruits. Respiratory:  Clear to auscultation, good air entry bilaterally.   Cardiac:  Regular rate and rhythm, no murmur.   Extremities:  Boney deformities or the wrist and hand.  Severe right FDI wasting.  No claw hand deformity Skin:  No rashes or lesions.  Neurological Exam: MENTAL STATUS including orientation to time, place, person, recent and remote memory, attention span and concentration, language, and fund of knowledge is normal.  Speech is not dysarthric.  CRANIAL NERVES: II:  No visual field  defects.  Unremarkable fundi.   III-IV-VI: Pupils equal round and reactive to light.  Normal conjugate, extra-ocular eye movements in all directions of gaze.  No nystagmus.  No ptosis.   V:  Normal facial sensation.  VII:  Normal facial symmetry and movements.  VIII:  Normal hearing and vestibular function.   IX-X:  Normal palatal movement.   XI:  Normal shoulder shrug and head rotation.   XII:  Normal tongue strength and range of motion, no deviation or fasciculation.  MOTOR:  Profound right FDI, ADM, and intrinsic hand muscle atrophy.  No fasciculations or abnormal movements.  No pronator drift.  Tone is normal.    Right Upper Extremity:    Left Upper Extremity:    Deltoid  5/5   Deltoid  5/5   Biceps  5/5   Biceps  5/5   Triceps  5/5   Triceps  5/5   Wrist extensors  5-/5   Wrist extensors  5/5   Wrist flexors  5/5   Wrist flexors  5/5   Finger extensors  4+/5   Finger extensors  5/5   Finger flexors  5-/5   Finger flexors  5/5   FPL 4/5  FPL 5/5  Dorsal interossei  4/5   Dorsal interossei  5/5   Abductor pollicis  4+/5   Abductor pollicis  5/5   Tone (Ashworth scale)  0  Tone (Ashworth scale)  0   Right Lower Extremity:    Left Lower Extremity:    Hip flexors  5/5   Hip flexors  5/5   Hip extensors  5/5   Hip extensors  5/5   Knee flexors   5/5   Knee flexors  5/5   Knee extensors  5/5   Knee extensors  5/5   Dorsiflexors  5/5   Dorsiflexors  5/5   Plantarflexors  5/5   Plantarflexors  5/5   Toe extensors  5/5   Toe extensors  5/5   Toe flexors  5/5   Toe flexors  5/5   Tone (Ashworth scale)  0  Tone (Ashworth scale)  0   MSRs:  Right                                                                 Left brachioradialis 2+  brachioradialis 2+  biceps 2+  biceps 2+  triceps tr  triceps 2+  patellar 2+  patellar 2+  ankle jerk 2+  ankle jerk 2+  Hoffman no  Hoffman no  plantar response down  plantar response down   SENSORY:  Normal and symmetric perception of light touch, pinprick, vibration, and proprioception.   COORDINATION/GAIT: Normal finger-to- nose-finger.  Intact rapid alternating movements bilaterally.   Gait narrow based and stable.    IMPRESSION: Mr. Voong is a 72 year-old man referred for evaluation of right hand muscle atrophy. His exam shows weakness and atrophy affecting C8 > C7 myotomes, but does not complain of sensory loss over the medial hand.  His right triceps reflex is also diminished as compared to the left.  These findings are most indicative of cervical radiculopathy. MRI cervical spine wo contrast will be ordered to determine the severity of degenerative changes and nerve impingement.  I explained that given the  chronicity of symptoms, he would unlikely see improvement, however, if there is severe stenosis, surgery would prevent further progression.  If needed, NCS/EMG of the right arm can be performed, pending results of imaging. Occupational therapy was declined.   With his history of BDZ addiction, I will not prescribe valium for sedation. If needed for MRI, he will need to request this through his addiction specialist.    Thank you for allowing me to participate in patient's care.  If I can answer any additional questions, I would be pleased to do so.    Sincerely,    Donika K. Posey Pronto,  DO

## 2017-11-06 ENCOUNTER — Telehealth: Payer: Self-pay | Admitting: Rheumatology

## 2017-11-06 NOTE — Telephone Encounter (Signed)
Patient requests a copy of all xrays to be put on a CD if possible today. Patient has an appt at 3pm in [place] for a stem cell research program. Please call when ready to pick up.

## 2017-11-06 NOTE — Telephone Encounter (Signed)
Xrays have been put on a CD. Roxy called the patient and advised patient it was ready for pick up.

## 2017-11-11 ENCOUNTER — Telehealth: Payer: Self-pay | Admitting: Neurology

## 2017-11-11 NOTE — Telephone Encounter (Signed)
Patient called regarding having a Referral for Therapy on his Shoulder. Please Call. Thanks

## 2017-11-12 NOTE — Telephone Encounter (Signed)
Called patient and left message for him to call me and leave a message if he would like to have shoulder therapy.

## 2017-11-21 ENCOUNTER — Telehealth: Payer: Self-pay | Admitting: *Deleted

## 2017-11-21 NOTE — Telephone Encounter (Signed)
PT and Hand appointment.  11-25-17 at 1:00.

## 2017-12-12 ENCOUNTER — Encounter: Payer: Self-pay | Admitting: Cardiology

## 2017-12-12 ENCOUNTER — Ambulatory Visit: Payer: Federal, State, Local not specified - PPO | Admitting: Cardiology

## 2017-12-12 VITALS — BP 160/90 | HR 42 | Ht 70.0 in | Wt 161.4 lb

## 2017-12-12 DIAGNOSIS — F419 Anxiety disorder, unspecified: Secondary | ICD-10-CM | POA: Diagnosis not present

## 2017-12-12 DIAGNOSIS — R0602 Shortness of breath: Secondary | ICD-10-CM | POA: Diagnosis not present

## 2017-12-12 DIAGNOSIS — R001 Bradycardia, unspecified: Secondary | ICD-10-CM

## 2017-12-12 NOTE — Patient Instructions (Signed)
Medication Instructions:  Your provider recommends that you continue on your current medications as directed. Please refer to the Current Medication list given to you today.    Labwork: None  Testing/Procedures: Your provider has requested that you have an exercise tolerance test. For further information please visit [email or web address]. Please also follow instruction sheet, as given.  Follow-Up: Your provider recommends that you schedule a follow-up appointment AS NEEDED with Dr. Marlou Porch pending study results.

## 2017-12-12 NOTE — Progress Notes (Signed)
Cardiology Office Note:    Date:  12/12/2017   ID:  Ernest Sanders, DOB December 16, 1945, MRN [id number]  PCP:  Ann Held, DO  Cardiologist:  No primary care provider on file.  Electrophysiologist:  None   Referring MD: [name], [name], *     History of Present Illness:    Ernest Sanders is a 72 y.o. male here for the evaluation of bradycardia at the request of Ernest Sanders, [place].  In review of office visit from 12/09/2017 he was describing fatigue with higher blood pressure readings than normal and home blood pressure machine 2 times a day checking heart rate in the 40s most the time with fatigue.  Denies any pain shortness of breath dizziness numbness tingling.  On no AV nodal blocking agents.  A troponin was drawn and was normal.  TSH was 2.7, normal, electro lites were normal as well.  Chest x-ray was performed and showed no active cardiopulmonary disease.  Mowed grass 50 min solid, push mowing, HR up to 60. Used to be above 60 all the time. In 11/2017 did not push mow, this is when low HR started. Prior 60-70.  Only on occasion was a little slow.  Was taking L-arginine. Tried 2. Quit this weekend. Researched.  No syncope. Mild SOB with anxiety. Off valium and librium.   Previously in 2014 he had an echocardiogram which showed only mild mitral regurgitation.  I do not hear any significant murmurs on exam today.  EF was 55%.  He also tells me that he had some tick bites earlier in the year, red patch, was treated with tetracycline for his fingertip infection concomitantly.  Past Medical History:  Diagnosis Date  . 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 Anxiety   . Arthritis   . Asthma    slight  . Atrophy of muscle of both hands 08/05/2017  . DDD (degenerative disc disease), cervical 08/05/2017  . Depression   . Enlarged prostate   . GERD (gastroesophageal reflux disease)   . History of asthma 08/05/2017  . History of gastroesophageal reflux (GERD) 08/05/2017  .  HTN (hypertension)   . Multiple allergies   . Primary osteoarthritis of both hands 08/05/2017  . Recurrent infections 08/05/2017  . 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 Shortness of breath dyspnea     Past Surgical History:  Procedure Laterality Date  . INGUINAL HERNIA REPAIR Bilateral 11/21/2015   Procedure: OPEN BILATERAL INGUINAL HERNIA REPAIR;  Surgeon: [name], MD;  Location: [place];  Service: General;  Laterality: Bilateral;  . INSERTION OF MESH Bilateral 11/21/2015   Procedure: INSERTION OF MESH;  Surgeon: [name], MD;  Location: [place];  Service: General;  Laterality: Bilateral;  . LIPOMA EXCISION N/A 11/21/2015   Procedure: EXCISION TWO ABDOMINAL WALL LIPOMAS;  Surgeon: [name], MD;  Location: [place];  Service: General;  Laterality: N/A;  . UMBILICAL HERNIA REPAIR  2002    Current Medications: Current Meds  Medication Sig  . aspirin 81 MG tablet Take 81 mg by mouth daily.  . cetirizine (ZYRTEC) 10 MG tablet Take 10 mg by mouth daily.  . chlordiazePOXIDE (LIBRIUM) 5 MG capsule Take 5 mg by mouth at bedtime.   Marland Kitchen EPINEPHrine 0.3 mg/0.3 mL IJ SOAJ injection Inject 0.3 mLs into the muscle as needed (allergic reaction).   . fluticasone (FLONASE) 50 MCG/ACT nasal spray SHAKE LQ AND U 1 TO 2 SPRAYS IEN QD  . Multiple  Vitamin (MULTIVITAMIN) tablet Take 1 tablet by mouth daily.  . [DISCONTINUED] meloxicam (MOBIC) 15 MG tablet Take 0.5-1 tablets (7.5-15 mg total) by mouth daily as needed for pain.     Allergies:   Carrot [daucus carota] and Lactose intolerance (gi)   Social History   Socioeconomic History  . Marital status: Single    Spouse name: Not on file  . Number of children: 0  . Years of education: 76  . Highest education level: Not on file  Occupational History  . Occupation: retired Scientist, research (medical)  Social Needs  . Financial resource strain: Not on file  . Food insecurity:    Worry: Not on file    Inability: Not on file  . Transportation needs:      Medical: Not on file    Non-medical: Not on file  Tobacco Use  . Smoking status: Former Smoker    Types: Pipe, Cigars    Last attempt to quit: 1984    Years since quitting: 35.8  . Smokeless tobacco: Never Used  Substance and Sexual Activity  . Alcohol use: No    Comment: Recovering, November 1972  . Drug use: No  . Sexual activity: Not Currently  Lifestyle  . Physical activity:    Days per week: Not on file    Minutes per session: Not on file  . Stress: Not on file  Relationships  . Social connections:    Talks on phone: Not on file    Gets together: Not on file    Attends religious service: Not on file    Active member of club or organization: Not on file    Attends meetings of clubs or organizations: Not on file    Relationship status: Not on file  Other Topics Concern  . Not on file  Social History Narrative   Lives alone in a one story home.  No children.  Retired Scientist, research (medical).  Education: some college.      Family History: The patient's family history includes Arthritis in his unknown relative; Diabetes in his mother; Heart disease in his father; Hypertension in his mother; Stroke in his paternal grandmother.  ROS:   Please see the history of present illness.    No syncope, no chest pain, no fevers chills nausea vomiting positive for anxiety all other systems reviewed and are negative.  EKGs/Labs/Other Studies Reviewed:    The following studies were reviewed today: EKG, prior office note, blood work reviewed.  EKG:  EKG is  ordered today.  The ekg ordered today demonstrates   Recent Labs: 05/21/2017: ALT 14; BUN 20; Creatinine, Ser 1.10; Hemoglobin 15.3; Platelets 219.0; Potassium 4.7; Sodium 143 06/25/2017: TSH 4.34  Recent Lipid Panel No results found for: CHOL, TRIG, HDL, CHOLHDL, VLDL, LDLCALC, LDLDIRECT  Physical Exam:    VS:  BP (!) 160/90   Pulse (!) 42   Ht 5\' 10"  (1.778 m)   Wt 161 lb 6.4 oz (73.2 kg)   SpO2 97%   BMI 23.16 kg/m     Wt  Readings from Last 3 Encounters:  12/12/17 161 lb 6.4 oz (73.2 kg)  10/17/17 162 lb 2 oz (73.5 kg)  08/29/17 160 lb (72.6 kg)     GEN:  Well nourished, well developed in no acute distress HEENT: Normal NECK: No JVD; No carotid bruits LYMPHATICS: No lymphadenopathy CARDIAC: Brady reg, no murmurs, rubs, gallops RESPIRATORY:  Clear to auscultation without rales,no rhonchi mild wheeze, RLL.  ABDOMEN: Soft, non-tender, non-distended MUSCULOSKELETAL:  No  edema; No deformity  SKIN: Warm and dry NEUROLOGIC:  Alert and oriented x 3 PSYCHIATRIC:  Normal affect   ASSESSMENT:    1. Bradycardia   2. Shortness of breath   3. Anxiety    PLAN:    In order of problems listed above:  Bradycardia - EKG shows sinus bradycardia 42 bpm with normal intervals otherwise.  He thinks that prior to September his heart rate was normally in the 60s.  He did have treatment for [facility] spotted fever with tetracycline. - We will go ahead and check chronotropic competence with a exercise treadmill test.  He is on no AV nodal blocking agents, continue to avoid.  He is also off of supplement L-arginine as well. -TSH normal.  Reassuring.  I am unsure that his bradycardia is being associated with his fatigue.  Shortness of breath - Usually accompanies his anxiety.  Offered him potential echocardiogram but this was done in 2014 and was demonstrating normal EF with only mild mitral regurgitation.  I do not hear any significant changes on exam, no heart murmurs.  He remembers being fairly anxious during that study and would like to forego it at this time.  This seems reasonable.  Anxiety -He is concerned about his Librium stopping.   Medication Adjustments/Labs and Tests Ordered: Current medicines are reviewed at length with the patient today.  Concerns regarding medicines are outlined above.  Orders Placed This Encounter  Procedures  . EXERCISE TOLERANCE TEST (ETT)  . EKG 12-Lead   No orders of the  defined types were placed in this encounter.   Patient Instructions  Medication Instructions:  Your provider recommends that you continue on your current medications as directed. Please refer to the Current Medication list given to you today.    Labwork: None  Testing/Procedures: Your provider has requested that you have an exercise tolerance test. For further information please visit [email or web address]. Please also follow instruction sheet, as given.  Follow-Up: Your provider recommends that you schedule a follow-up appointment AS NEEDED with Dr. Marlou Porch pending study results.    Signed, Candee Furbish, MD  12/12/2017 10:05 AM    [place]

## 2017-12-18 ENCOUNTER — Ambulatory Visit (INDEPENDENT_AMBULATORY_CARE_PROVIDER_SITE_OTHER): Payer: Federal, State, Local not specified - PPO

## 2017-12-18 DIAGNOSIS — R001 Bradycardia, unspecified: Secondary | ICD-10-CM | POA: Diagnosis not present

## 2017-12-18 LAB — EXERCISE TOLERANCE TEST
CHL RATE OF PERCEIVED EXERTION: 12
CSEPEDS: 25 s
CSEPPHR: 129 {beats}/min
Estimated workload: 3.5 METS
Exercise duration (min): 1 min
MPHR: 149 {beats}/min
Percent HR: 86 %
Rest HR: 42 {beats}/min

## 2017-12-24 ENCOUNTER — Telehealth: Payer: Self-pay | Admitting: Cardiology

## 2017-12-24 NOTE — Telephone Encounter (Signed)
Notes recorded by Jerline Pain, MD on 12/18/2017 at 5:44 PM EDT There is no evidence of chronotropic incompetence. Excellent result from that front. However, heart rate did increase quite quickly and he did have reduced exercise tolerance. In speaking with Valetta Fuller, she states that he could have gone further on the treadmill. Ultimately, this test answers the question of does he need a pacemaker. The answer is no. Continue with endurance efforts. Candee Furbish, MD  Attempted to call patient back to review results and recommendations.  NA-left message as instructed on voicemail that Dr Marlou Porch recommendation is for him to continue to exercise to build up his endurance which will lead to a more stable heart rate.  He is to follow up with Dr Marlou Porch as needed.  Requested patient call back with any further questions or concerns.

## 2017-12-24 NOTE — Telephone Encounter (Signed)
New Message   Pt states he had a  Stress test that showed he has a low heart right and he received the results on my chart, but states he was not told what he needed to do next. Please call

## 2017-12-25 ENCOUNTER — Ambulatory Visit (INDEPENDENT_AMBULATORY_CARE_PROVIDER_SITE_OTHER): Payer: Federal, State, Local not specified - PPO | Admitting: Orthopedic Surgery

## 2017-12-25 ENCOUNTER — Encounter (INDEPENDENT_AMBULATORY_CARE_PROVIDER_SITE_OTHER): Payer: Self-pay | Admitting: Orthopedic Surgery

## 2017-12-25 DIAGNOSIS — M12811 Other specific arthropathies, not elsewhere classified, right shoulder: Secondary | ICD-10-CM

## 2017-12-26 NOTE — Telephone Encounter (Signed)
Pt ha not called back with any further questions/concerns.  Will close this encounter.

## 2017-12-29 ENCOUNTER — Encounter (INDEPENDENT_AMBULATORY_CARE_PROVIDER_SITE_OTHER): Payer: Self-pay | Admitting: Orthopedic Surgery

## 2017-12-29 NOTE — Progress Notes (Signed)
Office Visit Note   Patient: Ernest Sanders           Date of Birth: 1945/08/05           MRN: [id number] Visit Date: 12/25/2017 Requested by: [address] [place], [place] [address] [place], [place] [address] PCP: [name], [name], DO  Subjective: Chief Complaint  Patient presents with  . Right Shoulder - Pain    HPI: Ernest Sanders is a patient with right shoulder pain since spring.  He is right-hand dominant.  No specific injury but it started while he was working with a chainsaw and doing some tree work.  He has been to physical therapy without relief.  He believes his right shoulder sticking out farther than the left.  He does report decreased strength and inability to do a push-up.  Reports a lot of anterior pain.  Had an injection 08/06/2017.  Radiographs on the system are consistent with superior migration of the humeral head and rotator cuff arthropathy.  Not taking any medication for the problem.  He was diagnosed with [facility] spotted fever last year.  Also has rheumatoid arthritis.              ROS: All systems reviewed are negative as they relate to the chief complaint within the history of present illness.  Patient denies  fevers or chills.   Assessment & Plan: Visit Diagnoses:  1. Rotator cuff arthropathy of right shoulder     Plan: Impression is rotator cuff arthropathy right shoulder with ultrasound examination today showing tearing of the subscapularis muscle.  Plan is observation for now.  He is very functional for someone his age.  Has very good strength.  I would favor observation for now until his pain and loss of function worsen.  We will see him back as needed.  Follow-Up Instructions: Return if symptoms worsen or fail to improve.   Orders:  No orders of the defined types were placed in this encounter.  No orders of the defined types were placed in this encounter.     Procedures: No procedures performed   Clinical Data: No additional  findings.  Objective: Vital Signs: There were no vitals taken for this visit.  Physical Exam:   Constitutional: Patient appears well-developed HEENT:  Head: Normocephalic Eyes:EOM are normal Neck: Normal range of motion Cardiovascular: Normal rate Pulmonary/chest: Effort normal Neurologic: Patient is alert Skin: Skin is warm Psychiatric: Patient has normal mood and affect    Ortho Exam: Orthopedic exam demonstrates pretty good active and passive range of motion on the right-hand side above 90 degrees of abduction and forward flexion.  He does have weakness to subscap testing on the right compared to the left.  Infraspinatus testing is actually reasonably strong at 4+ out of 5.  He has mild tenderness to palpation of the acromion.  Motor or sensory function to the hand is intact  Specialty Comments:  No specialty comments available.  Imaging: No results found.   PMFS History: Patient Active Problem List   Diagnosis Date Noted  . Primary osteoarthritis of both hands 08/05/2017  . Primary osteoarthritis of both knees 08/05/2017  . Primary osteoarthritis of both feet 08/05/2017  . DDD (degenerative disc disease), cervical 08/05/2017  . Atrophy of muscle of both hands 08/05/2017  . History of gastroesophageal reflux (GERD) 08/05/2017  . History of asthma 08/05/2017  . Recurrent infections 08/05/2017  . 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 Bilateral  inguinal hernia without obstruction or gangrene 10/21/2015  . HTN (hypertension) 04/27/2012   Past Medical History:  Diagnosis Date  . 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 Anxiety   . Arthritis   . Asthma    slight  . Atrophy of muscle of both hands 08/05/2017  . DDD (degenerative disc disease), cervical 08/05/2017  . Depression   . Enlarged prostate   . GERD (gastroesophageal reflux disease)   . History of asthma 08/05/2017  . History of gastroesophageal reflux (GERD) 08/05/2017  . HTN (hypertension)   .  Multiple allergies   . Primary osteoarthritis of both hands 08/05/2017  . Recurrent infections 08/05/2017  . 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 Shortness of breath dyspnea     Family History  Problem Relation Age of Onset  . Heart disease Father        CHF  . Hypertension Mother   . Diabetes Mother        Surgical  . Arthritis Unknown   . Stroke Paternal Grandmother     Past Surgical History:  Procedure Laterality Date  . INGUINAL HERNIA REPAIR Bilateral 11/21/2015   Procedure: OPEN BILATERAL INGUINAL HERNIA REPAIR;  Surgeon: [name], MD;  Location: [place];  Service: General;  Laterality: Bilateral;  . INSERTION OF MESH Bilateral 11/21/2015   Procedure: INSERTION OF MESH;  Surgeon: [name], MD;  Location: [place];  Service: General;  Laterality: Bilateral;  . LIPOMA EXCISION N/A 11/21/2015   Procedure: EXCISION TWO ABDOMINAL WALL LIPOMAS;  Surgeon: [name], MD;  Location: [place];  Service: General;  Laterality: N/A;  . UMBILICAL HERNIA REPAIR  2002   Social History   Occupational History  . Occupation: retired Scientist, research (medical)  Tobacco Use  . Smoking status: Former Smoker    Types: Pipe, Cigars    Last attempt to quit: 1984    Years since quitting: 35.8  . Smokeless tobacco: Never Used  Substance and Sexual Activity  . Alcohol use: No    Comment: Recovering, November 1972  . Drug use: No  . Sexual activity: Not Currently

## 2018-01-27 NOTE — Progress Notes (Deleted)
Office Visit Note  Patient: Ernest Sanders             Date of Birth: December 13, 1945           MRN: [id number]             PCP: Ann Held, DO Referring: Ann Held, * Visit Date: 02/10/2018 Occupation: @GUAROCC @  Subjective:  No chief complaint on file.   History of Present Illness: Ernest Sanders is a 72 y.o. male ***   Activities of Daily Living:  Patient reports morning stiffness for *** {minute/hour:19697}.   Patient {ACTIONS;DENIES/REPORTS:21021675::"Denies"} nocturnal pain.  Difficulty dressing/grooming: {ACTIONS;DENIES/REPORTS:21021675::"Denies"} Difficulty climbing stairs: {ACTIONS;DENIES/REPORTS:21021675::"Denies"} Difficulty getting out of chair: {ACTIONS;DENIES/REPORTS:21021675::"Denies"} Difficulty using hands for taps, buttons, cutlery, and/or writing: {ACTIONS;DENIES/REPORTS:21021675::"Denies"}  No Rheumatology ROS completed.   PMFS History:  Patient Active Problem List   Diagnosis Date Noted  . Primary osteoarthritis of both hands 08/05/2017  . Primary osteoarthritis of both knees 08/05/2017  . Primary osteoarthritis of both feet 08/05/2017  . DDD (degenerative disc disease), cervical 08/05/2017  . Atrophy of muscle of both hands 08/05/2017  . History of gastroesophageal reflux (GERD) 08/05/2017  . History of asthma 08/05/2017  . Recurrent infections 08/05/2017  . 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 Bilateral inguinal hernia without obstruction or gangrene 10/21/2015  . HTN (hypertension) 04/27/2012    Past Medical History:  Diagnosis Date  . 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 Anxiety   . Arthritis   . Asthma    slight  . Atrophy of muscle of both hands 08/05/2017  . DDD (degenerative disc disease), cervical 08/05/2017  . Depression   . Enlarged prostate   . GERD (gastroesophageal reflux disease)   . History of asthma 08/05/2017  . History of gastroesophageal reflux (GERD) 08/05/2017  . HTN (hypertension)     . Multiple allergies   . Primary osteoarthritis of both hands 08/05/2017  . Recurrent infections 08/05/2017  . 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 Shortness of breath dyspnea     Family History  Problem Relation Age of Onset  . Heart disease Father        CHF  . Hypertension Mother   . Diabetes Mother        Surgical  . Arthritis Unknown   . Stroke Paternal Grandmother    Past Surgical History:  Procedure Laterality Date  . INGUINAL HERNIA REPAIR Bilateral 11/21/2015   Procedure: OPEN BILATERAL INGUINAL HERNIA REPAIR;  Surgeon: [name], MD;  Location: [place];  Service: General;  Laterality: Bilateral;  . INSERTION OF MESH Bilateral 11/21/2015   Procedure: INSERTION OF MESH;  Surgeon: [name], MD;  Location: [place];  Service: General;  Laterality: Bilateral;  . LIPOMA EXCISION N/A 11/21/2015   Procedure: EXCISION TWO ABDOMINAL WALL LIPOMAS;  Surgeon: [name], MD;  Location: [place];  Service: General;  Laterality: N/A;  . UMBILICAL HERNIA REPAIR  2002   Social History   Social History Narrative   Lives alone in a one story home.  No children.  Retired Scientist, research (medical).  Education: some college.     Objective: Vital Signs: There were no vitals taken for this visit.   Physical Exam   Musculoskeletal Exam: ***  CDAI Exam: CDAI Score: Not documented Patient Global Assessment: Not documented; Provider Global Assessment: Not documented Swollen: Not documented; Tender: Not documented Joint Exam   Not documented   There is currently no information  documented on the homunculus. Go to the Rheumatology activity and complete the homunculus joint exam.  Investigation: No additional findings.  Imaging: No results found.  Recent Labs: Lab Results  Component Value Date   WBC 6.8 05/21/2017   HGB 15.3 05/21/2017   PLT 219.0 05/21/2017   NA 143 05/21/2017   K 4.7 05/21/2017   CL 107 05/21/2017   CO2 28 05/21/2017   GLUCOSE 119 (H) 05/21/2017   BUN 20  05/21/2017   CREATININE 1.10 05/21/2017   BILITOT 0.2 05/21/2017   ALKPHOS 48 05/21/2017   AST 26 05/21/2017   ALT 14 05/21/2017   PROT 7.1 05/21/2017   ALBUMIN 4.4 05/21/2017   CALCIUM 9.9 05/21/2017   GFRAA >60 11/14/2015    Speciality Comments: No specialty comments available.  Procedures:  No procedures performed Allergies: Carrot [daucus carota] and Lactose intolerance (gi)   Assessment / Plan:     Visit Diagnoses: No diagnosis found.   Orders: No orders of the defined types were placed in this encounter.  No orders of the defined types were placed in this encounter.   Face-to-face time spent with patient was *** minutes. Greater than 50% of time was spent in counseling and coordination of care.  Follow-Up Instructions: No follow-ups on file.   Earnestine Mealing, CMA  Note - This record has been created using Editor, commissioning.  Chart creation errors have been sought, but may not always  have been located. Such creation errors do not reflect on  the standard of medical care.

## 2018-01-28 ENCOUNTER — Telehealth: Payer: Self-pay | Admitting: *Deleted

## 2018-01-28 NOTE — Telephone Encounter (Signed)
Received request for Medical Records from [facility];  forwarded to Medical Records via email/scan/SLS

## 2018-02-10 ENCOUNTER — Ambulatory Visit: Payer: Federal, State, Local not specified - PPO | Admitting: Rheumatology

## 2018-05-11 ENCOUNTER — Other Ambulatory Visit: Payer: Self-pay | Admitting: Cardiology

## 2018-05-12 LAB — LIPID PANEL W/O CHOL/HDL RATIO
Cholesterol, Total: 172 mg/dL (ref 100–199)
HDL: 63 mg/dL (ref >39)
LDL Calculated: 91 mg/dL (ref 0–99)
Triglycerides: 89 mg/dL (ref 0–149)
VLDL Cholesterol Cal: 18 mg/dL (ref 5–40)

## 2018-05-20 ENCOUNTER — Ambulatory Visit: Payer: Federal, State, Local not specified - PPO | Admitting: Cardiology

## 2018-05-20 ENCOUNTER — Other Ambulatory Visit: Payer: Self-pay

## 2018-05-20 ENCOUNTER — Encounter: Payer: Self-pay | Admitting: Cardiology

## 2018-05-20 VITALS — BP 132/75 | HR 49 | Ht 69.0 in | Wt 164.6 lb

## 2018-05-20 DIAGNOSIS — R001 Bradycardia, unspecified: Secondary | ICD-10-CM

## 2018-05-20 DIAGNOSIS — E785 Hyperlipidemia, unspecified: Secondary | ICD-10-CM | POA: Diagnosis not present

## 2018-05-20 DIAGNOSIS — I491 Atrial premature depolarization: Secondary | ICD-10-CM

## 2018-05-20 DIAGNOSIS — I471 Supraventricular tachycardia: Secondary | ICD-10-CM

## 2018-05-20 DIAGNOSIS — I4719 Other supraventricular tachycardia: Secondary | ICD-10-CM

## 2018-05-20 DIAGNOSIS — Z0189 Encounter for other specified special examinations: Secondary | ICD-10-CM | POA: Insufficient documentation

## 2018-05-20 HISTORY — DX: Supraventricular tachycardia: I47.1

## 2018-05-20 HISTORY — DX: Other supraventricular tachycardia: I47.19

## 2018-05-20 HISTORY — DX: Atrial premature depolarization: I49.1

## 2018-05-20 NOTE — Progress Notes (Signed)
Subjective:  Primary Physician:  Ann Held, DO  Patient ID: Ernest Sanders, male    DOB: [birth year], 73 y.o.   MRN: [id number]  Chief Complaint  Patient presents with  . Tachycardia  . Hypertension  . Follow-up    HPI: Ernest Sanders  is a 73 y.o. 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  Normal LVEF by echocardiogram.  Due to hypertension he was started on valsartan HCT and also started on low-dose of statins due to mild hyperlipidemia and abnormal stress test and underwent lab testing and presents for follow-up.  States that he is tolerating all the medications well, has not had any significant side effects although continues to have irregular pulse when he checks it.  No dizziness, fatigue is gradually improving, tolerating a statin without any side effects.  Past Medical History:  Diagnosis Date  . 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 Anxiety   . Arthritis   . Asthma    slight  . Atrial tachycardia ([place]) 05/20/2018  . Atrophy of muscle of both hands 08/05/2017  . DDD (degenerative disc disease), cervical 08/05/2017  . Depression   . Enlarged prostate   . GERD (gastroesophageal reflux disease)   . History of asthma 08/05/2017  . History of gastroesophageal reflux (GERD) 08/05/2017  . HTN (hypertension)   . Multiple allergies   . PAC (premature atrial contraction) 05/20/2018  . Primary osteoarthritis of both hands 08/05/2017  . Recurrent infections 08/05/2017  . RMSF [facility] spotted fever) 11/08/2016  . Shortness of breath dyspnea     Past Surgical History:  Procedure Laterality Date  . INGUINAL HERNIA REPAIR Bilateral  11/21/2015   Procedure: OPEN BILATERAL INGUINAL HERNIA REPAIR;  Surgeon: [name], MD;  Location: [place];  Service: General;  Laterality: Bilateral;  . INSERTION OF MESH Bilateral 11/21/2015   Procedure: INSERTION OF MESH;  Surgeon: [name], MD;  Location: [place];  Service: General;  Laterality: Bilateral;  . LIPOMA EXCISION N/A 11/21/2015   Procedure: EXCISION TWO ABDOMINAL WALL LIPOMAS;  Surgeon: [name], MD;  Location: [place];  Service: General;  Laterality: N/A;  . UMBILICAL HERNIA REPAIR  2002    Social History   Socioeconomic History  . Marital status: Single    Spouse name: Not on file  . Number of children: 0  . Years of education: 73  . Highest education level: Not on file  Occupational History  . Occupation: retired Scientist, research (medical)  Social Needs  . Financial resource strain: Not on file  . Food insecurity:    Worry: Not on file    Inability: Not on file  . Transportation needs:    Medical: Not on file    Non-medical: Not on file  Tobacco Use  . Smoking status: Former Smoker    Types: Pipe, Cigars    Last attempt to quit: 1984    Years since quitting: 36.2  . Smokeless tobacco: Never Used  Substance and Sexual Activity  . Alcohol use: No    Comment: Recovering, November 1972  . Drug use: No  . Sexual activity: Not Currently  Lifestyle  . Physical  activity:    Days per week: Not on file    Minutes per session: Not on file  . Stress: Not on file  Relationships  . Social connections:    Talks on phone: Not on file    Gets together: Not on file    Attends religious service: Not on file    Active member of club or organization: Not on file    Attends meetings of clubs or organizations: Not on file    Relationship status: Not on file  . Intimate partner violence:    Fear of current or ex partner: Not on file    Emotionally abused: Not on file    Physically abused: Not on file    Forced sexual activity: Not on file  Other Topics Concern  . Not on  file  Social History Narrative   Lives alone in a one story home.  No children.  Retired Scientist, research (medical).  Education: some college.     Current Outpatient Medications on File Prior to Visit  Medication Sig Dispense Refill  . aspirin 81 MG tablet Take 81 mg by mouth daily.    . cetirizine (ZYRTEC) 10 MG tablet Take 10 mg by mouth daily.    . chlordiazePOXIDE (LIBRIUM) 5 MG capsule Take 5 mg by mouth at bedtime.   1  . EPINEPHrine 0.3 mg/0.3 mL IJ SOAJ injection Inject 0.3 mLs into the muscle as needed (allergic reaction).   1  . fluticasone (FLONASE) 50 MCG/ACT nasal spray SHAKE LQ AND U 1 TO 2 SPRAYS IEN QD  6  . Multiple Vitamin (MULTIVITAMIN) tablet Take 1 tablet by mouth daily.    . pravastatin (PRAVACHOL) 20 MG tablet Take 20 mg by mouth daily after supper.    . valsartan-hydrochlorothiazide (DIOVAN-HCT) 160-12.5 MG tablet Take 1 tablet by mouth every morning.    . verapamil (CALAN-SR) 120 MG CR tablet Take 120 mg by mouth at bedtime.     No current facility-administered medications on file prior to visit.     Review of Systems  Constitutional: Positive for malaise/fatigue (chronic and improving). Negative for weight loss.  Respiratory: Negative for cough, hemoptysis and shortness of breath.   Cardiovascular: Positive for palpitations. Negative for chest pain, claudication and leg swelling.  Gastrointestinal: Negative for abdominal pain, blood in stool, constipation, heartburn and vomiting.  Genitourinary: Negative for dysuria.  Musculoskeletal: Positive for joint pain. Negative for myalgias.  Neurological: Negative for dizziness, focal weakness and headaches.  Endo/Heme/Allergies: Does not bruise/bleed easily.  Psychiatric/Behavioral: Negative for depression. The patient is not nervous/anxious.   All other systems reviewed and are negative.      Objective:  Blood pressure 132/75, pulse (!) 49, height 5' 9"  (1.753 m), weight 164 lb 9.6 oz (74.7 kg), SpO2 94 %. Body mass index is  24.31 kg/m.  Physical Exam  Constitutional: He appears well-developed and well-nourished. No distress.  HENT:  Head: Atraumatic.  Eyes: Conjunctivae are normal.  Neck: Neck supple. No JVD present. No thyromegaly present.  Cardiovascular: Normal rate, regular rhythm, normal heart sounds and intact distal pulses. Frequent extrasystoles are present. Exam reveals no gallop.  No murmur heard. Pulmonary/Chest: Effort normal and breath sounds normal.  Abdominal: Soft. Bowel sounds are normal.  Musculoskeletal: Normal range of motion.        General: No edema.  Neurological: He is alert.  Skin: Skin is warm and dry.  Psychiatric: He has a normal mood and affect.   CARDIAC STUDIES:   Exercise sestamibi  stress test 02/23/2018:  1. The patient performed treadmill exercise using Bruce protocol, completing 6:30 minutes. The patient completed an estimated workload of 6.5 METS, reaching 109% of the maximum predicted heart rate. Resting hypertension 164/90 mmHg, with exaggerated exercise response seen with peak BP reaching 210/72 mmHg. Exercise capacity was low normal. No stress symptoms reported. 2. The overall quality of the study is good. Left ventricular cavity is noted to be enlarged on the rest and stress studies. Gated SPECT images reveal global decrease in myocardial thickening and wall motion. The left ventricular ejection fraction was calculated or visually estimated to be 38%. Small sized, mild intensity, reversible perfusion defect in apical inferolateral myocardium. 3. High risk study due to reduced LVEF and regional perfusion defect.   Echocardiogram 03/03/2018:  Left ventricle cavity is normal in size. Mild  hypertrophy of the left ventricle. Normal global wall motion. Frequent premature beats are seen throughout the test. Calculated EF 64%.  2. Left atrial cavity is mildly dilated. 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  3. Mild (Grade I) aortic  regurgitation. 4. Mild (Grade I) mitral regurgitation. 5. Mild tricuspid regurgitation. No evidence of pulmonary hypertension.  Assessment & Recommendations:   1. PAC (premature atrial contraction) - EKG 12-Lead  2. Bradycardia EKG 05/20/2018: Normal sinus rhythm at rate of 72 bpm, left atrial enlargement, frequent PVCs and bigeminal pattern, nonspecific T abnormality. No significant change from  EKG 02/12/2018  3. Mild hyperlipidemia  4. Atrial tachycardia ([place])  5. Laboratory examination 02/26/2018: Glucose 104, creatinine 1.01, EGFR 70/86, potassium 4.1, BMP otherwise normal. Cholesterol 211, triglycerides 118, HDL 67, LDL 120. TSH 3.9. Hemoglobin A1c 6.2%. Lipid Panel     Component Value Date/Time   CHOL 172 05/11/2018 0819   TRIG 89 05/11/2018 0819   HDL 63 05/11/2018 0819   LDLCALC 91 05/11/2018 0819   Recommendation:   Patient is presently doing well, blood pressure is very well controlled on valsartan HCT, recent labs are also stable with no change in serum creatinine since initiation of valsartan.  He is tolerating low-dose pravastatin and LDL is not goal to less than 100 mg/dL.  Although he continues to have frequent PACs, no significant palpitations, essentially remains asymptomatic, would not pursue any further evaluation for the same.  I have reviewed the labs again with the patient along with stress test and echocardiogram, reassured him.  He appears to be stable from cardiac standpoint, I'll see him back in a year.  Adrian Prows, MD, [facility] 05/20/2018, 3:29 PM [facility] [place] Pager: [phone] Office: [phone] If no answer Cell [phone]

## 2018-05-21 ENCOUNTER — Encounter: Payer: Self-pay | Admitting: Cardiology

## 2018-05-29 ENCOUNTER — Other Ambulatory Visit: Payer: Self-pay | Admitting: Cardiology

## 2018-08-12 ENCOUNTER — Telehealth: Payer: Self-pay | Admitting: Family Medicine

## 2018-08-12 NOTE — Telephone Encounter (Signed)
PT dropped off medical release form From [facility]

## 2018-08-13 ENCOUNTER — Telehealth: Payer: Self-pay

## 2018-08-13 NOTE — Telephone Encounter (Signed)
Copied from [place] [phone]. Topic: General - Other >> Aug 13, 2018  1:47 PM Sanders, Ernest wrote: Reason for CRM: Pt stated he would like to change his medication from chlordiazePOXIDE (LIBRIUM) 5 MG capsule to Librax. Pt requests call back. Cb# [phone]

## 2018-10-04 ENCOUNTER — Other Ambulatory Visit: Payer: Self-pay | Admitting: Cardiology

## 2018-11-23 ENCOUNTER — Ambulatory Visit: Payer: Federal, State, Local not specified - PPO | Admitting: Family Medicine

## 2018-11-23 ENCOUNTER — Other Ambulatory Visit: Payer: Self-pay

## 2018-11-23 VITALS — BP 124/74 | Ht 70.0 in | Wt 164.0 lb

## 2018-11-23 DIAGNOSIS — M79671 Pain in right foot: Secondary | ICD-10-CM | POA: Diagnosis not present

## 2018-11-23 NOTE — Patient Instructions (Signed)
You have plantar fasciitis Take tylenol, use topical biofreeze for pain. Plantar fascia stretch for 20-30 seconds (do 3 of these) in morning Lowering/raise on a step exercises 3 x 10 once or twice a day - this is very important for long term recovery. Can add heel walks, toe walks forward and backward as well Ice heel for 15 minutes as needed. Avoid flat shoes/barefoot walking as much as possible. Arch straps have been shown to help with pain. Use the heel cups especially when you're going to be on your feet a lot Consider using inserts (dr. Zoe Lan active series, spencos, our green insoles, custom orthotics). Steroid injection is a consideration for short term pain relief if you are struggling. Physical therapy is also an option. Follow up with me in 6 weeks.

## 2018-11-23 NOTE — Progress Notes (Signed)
   Subjective:    Patient ID: Ernest Sanders, male    DOB: 10/12/1945, 73 y.o.   MRN: [id number]  HPI [name] is a 73 year old very pleasant gentleman with medical history significant for osteoarthritis and a prior history of plantar fasciitis here for evaluation of pain at the sole of his right foot (posteriorly).  He was in his usual state of health until about 3 weeks ago when he began noticing pain at the sole of his right foot.  This happened about 30 minutes into his daily morning routine which includes checking his yard.  Since onset, he has tried Tylenol, ibuprofen, Biofreeze and changing his shoes which he reports have improved his symptoms.  He states the pain is typically worse with ambulation and improves with rest.  He denies any involvement in regards activities, walking barefoot, numbness, tingling.  He does report that occasionally he would experience pain at his right heel and sometimes in the calf which he attributes to his longstanding muscle pain from "medications he is taking for his heart and cholesterol." no swelling or bruising.   Review of Systems  Constitutional: Negative.   HENT: Negative.   Respiratory: Negative.   Cardiovascular: Negative.   Gastrointestinal: Negative.   Musculoskeletal:       Pain at the sole of the right foot (posteriorly)  Skin: Negative.   Neurological: Negative for tingling and weakness.   PMH/SH/FH: Reviewed      Objective:     Today's Vitals   11/23/18 1451  BP: 124/74  Weight: 164 lb (74.4 kg)  Height: 5\' 10"  (1.778 m)   Body mass index is 23.53 kg/m.  Physical Exam  Constitutional: He is well-developed, well-nourished, and in no distress. No distress.  HENT:  Head: Normocephalic and atraumatic.  Eyes: Conjunctivae are normal.  Musculoskeletal: Normal range of motion.        General: No deformity or edema.  Neurological: He is alert.  Skin: Skin is warm. No rash noted. He is not diaphoretic. No  erythema.  Ankle/Foot (RIGHT): TTP noted at the anteromedial calcaneus at plantar fascia insertion. No tenderness of achilles. No visible erythema, swelling, ecchymosis, or bony deformity. No notable pes planus/cavus deformity. Transverse arch grossly intact; No evidence of tibiotalar deviation; Range of motion is full in all directions. Strength is 5/5 in all directions. No tenderness on posterior aspects of lateral and medial malleolus; Stable lateral and medial ligaments; Negative thompson test, negative anterior drawer test.  Negative talar tilt.  Left foot/ankle: No deformity, instability. FROM with 5/5 strength. No tenderness to palpation. NVI distally.    Assessment & Plan:  1. Plantar fasciitis:  - Advised to take Tylenol, trial of topical Biofreeze for pain - Plantar stretch exercises for 20 to 30 seconds in the morning - Lower and raise exercises on a step 3 x 10 also twice a day - Trial of ice heel for about 15 minutes as needed - Advised to avoid flat shoes or walking barefoot - May try arch straps - Follow-up in clinic in 6 weeks for further evaluation on improvement

## 2018-11-24 ENCOUNTER — Encounter: Payer: Self-pay | Admitting: Family Medicine

## 2019-01-27 ENCOUNTER — Ambulatory Visit: Payer: Federal, State, Local not specified - PPO | Admitting: Orthopedic Surgery

## 2019-01-27 ENCOUNTER — Other Ambulatory Visit: Payer: Self-pay

## 2019-01-27 DIAGNOSIS — M12811 Other specific arthropathies, not elsewhere classified, right shoulder: Secondary | ICD-10-CM | POA: Diagnosis not present

## 2019-01-31 ENCOUNTER — Encounter: Payer: Self-pay | Admitting: Orthopedic Surgery

## 2019-01-31 NOTE — Progress Notes (Signed)
Office Visit Note   Patient: Ernest Sanders           Date of Birth: [birth year]           MRN: [id number] Visit Date: 01/27/2019 Requested by: [address] [place], [place] [address] [place],  Ernest Sanders [id number] PCP: [name], [name], DO  Subjective: Chief Complaint  Patient presents with  . Right Shoulder - Pain    HPI: Ernest Sanders is a patient with right shoulder rotator cuff arthropathy.  Seen a year ago.  Tried stem cell therapy to the tendon about 7 or $8000 out-of-pocket which did not give him much relief.  Reinjured his shoulder 4 weeks ago.  Started a Therapist, music and he heard a pop and felt a snap.  He has been using a TENS unit.  Describes having Ernest Sanders spotted Sanders in 2018.  1 prior cortisone shot gave him some relief.  He has surprisingly good function with that right arm despite his rotator cuff arthropathy              ROS: All systems reviewed are negative as they relate to the chief complaint within the history of present illness.  Patient denies  fevers or chills.   Assessment & Plan: Visit Diagnoses:  1. Rotator cuff arthropathy of right shoulder     Plan: Impression is right shoulder rotator cuff arthropathy.  Plan is subacromial injection today for pain relief.  I think in general his pain is present but not severe and he is very functional with that right arm.  Reverse shoulder replacement would improve pain but not really necessarily predictably improve his function.  He does have 90 degrees or greater forward flexion abduction at this time.  I would favor observation for now.  I do think some therapy could be helpful work on functionality and deltoid strengthening.  He can do that at our facility 2 times a week for 2 to 6 weeks until he plateaus.  Follow-up with me in a couple months if he is not improving.  Follow-Up Instructions: Return if symptoms worsen or fail to improve.   Orders:  No orders of the defined types were placed in this  encounter.  No orders of the defined types were placed in this encounter.     Procedures: No procedures performed   Clinical Data: No additional findings.  Objective: Vital Signs: There were no vitals taken for this visit.  Physical Exam: .     Constitutional: Patient appears well-developed HEENT:  Head: Normocephalic Eyes:EOM are normal Neck: Normal range of motion Cardiovascular: Normal rate Pulmonary/chest: Effort normal Neurologic: Patient is alert Skin: Skin is warm Psychiatric: Patient has normal mood and affect   Ortho Exam: Ortho exam demonstrates full forward flexion abduction on the left.  Has a little bit of coarseness and grinding with passive range of motion on the right but does have abduction and forward flexion but easily about 90 degrees.  No restriction of passive external rotation on the right-hand side.  Radial pulse intact bilaterally.  Cervical spine range of motion full.  Does have some wasting in the supra and infraspinatus fossa.  Specialty Comments:  No specialty comments available.  Imaging: No results found.   PMFS History: Patient Active Problem List   Diagnosis Date Noted  . PAC (premature atrial contraction) 05/20/2018  . Atrial tachycardia ([place]) 05/20/2018  . Laboratory examination 05/20/2018  . Primary osteoarthritis of both hands 08/05/2017  . Primary  osteoarthritis of both knees 08/05/2017  . Primary osteoarthritis of both feet 08/05/2017  . DDD (degenerative disc disease), cervical 08/05/2017  . Atrophy of muscle of both hands 08/05/2017  . History of gastroesophageal reflux (GERD) 08/05/2017  . History of asthma 08/05/2017  . Recurrent infections 08/05/2017  . RMSF Ernest Sanders spotted Sanders) 11/08/2016  . Bilateral inguinal hernia without obstruction or gangrene 10/21/2015  . HTN (hypertension) 04/27/2012   Past Medical History:  Diagnosis Date  . 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 Anxiety   . Arthritis    . Asthma    slight  . Atrial tachycardia (Ernest Sanders) 05/20/2018  . Atrophy of muscle of both hands 08/05/2017  . DDD (degenerative disc disease), cervical 08/05/2017  . Depression   . Enlarged prostate   . GERD (gastroesophageal reflux disease)   . History of asthma 08/05/2017  . History of gastroesophageal reflux (GERD) 08/05/2017  . HTN (hypertension)   . Multiple allergies   . PAC (premature atrial contraction) 05/20/2018  . Primary osteoarthritis of both hands 08/05/2017  . Recurrent infections 08/05/2017  . RMSF Ernest Sanders) 11/08/2016  . Shortness of breath dyspnea     Family History  Problem Relation Age of Onset  . Heart disease Father        CHF  . Hypertension Mother   . Diabetes Mother        Surgical  . Arthritis Other   . Stroke Paternal Grandmother     Past Surgical History:  Procedure Laterality Date  . INGUINAL HERNIA REPAIR Bilateral 11/21/2015   Procedure: OPEN BILATERAL INGUINAL HERNIA REPAIR;  Surgeon: [name], MD;  Location: [place];  Service: General;  Laterality: Bilateral;  . INSERTION OF MESH Bilateral 11/21/2015   Procedure: INSERTION OF MESH;  Surgeon: [name], MD;  Location: [place];  Service: General;  Laterality: Bilateral;  . LIPOMA EXCISION N/A 11/21/2015   Procedure: EXCISION TWO ABDOMINAL WALL LIPOMAS;  Surgeon: [name], MD;  Location: [place];  Service: General;  Laterality: N/A;  . UMBILICAL HERNIA REPAIR  2002   Social History   Occupational History  . Occupation: retired Scientist, research (medical)  Tobacco Use  . Smoking status: Former Smoker    Types: Pipe, Cigars    Quit date: 1984    Years since quitting: 36.9  . Smokeless tobacco: Never Used  Substance and Sexual Activity  . Alcohol use: No    Comment: Recovering, November 1972  . Drug use: No  . Sexual activity: Not Currently

## 2019-02-08 ENCOUNTER — Encounter: Payer: Self-pay | Admitting: Cardiology

## 2019-02-23 ENCOUNTER — Other Ambulatory Visit: Payer: Self-pay

## 2019-02-23 MED ORDER — VALSARTAN-HYDROCHLOROTHIAZIDE 160-12.5 MG PO TABS: 1.0000 | ORAL_TABLET | ORAL | 0 refills | Status: DC

## 2019-03-17 ENCOUNTER — Ambulatory Visit: Payer: Federal, State, Local not specified - PPO | Admitting: Orthopedic Surgery

## 2019-04-02 ENCOUNTER — Other Ambulatory Visit: Payer: Self-pay

## 2019-04-05 ENCOUNTER — Encounter: Payer: Self-pay | Admitting: Family Medicine

## 2019-04-05 ENCOUNTER — Other Ambulatory Visit: Payer: Self-pay

## 2019-04-05 ENCOUNTER — Ambulatory Visit: Payer: Federal, State, Local not specified - PPO | Admitting: Family Medicine

## 2019-04-05 VITALS — BP 136/73 | HR 42 | Temp 96.8°F | Resp 12 | Ht 69.5 in | Wt 168.6 lb

## 2019-04-05 DIAGNOSIS — M25511 Pain in right shoulder: Secondary | ICD-10-CM

## 2019-04-05 DIAGNOSIS — G4700 Insomnia, unspecified: Secondary | ICD-10-CM | POA: Diagnosis not present

## 2019-04-05 MED ORDER — TRAZODONE HCL 50 MG PO TABS: 25.0000 mg | ORAL_TABLET | Freq: Every evening | ORAL | 3 refills | Status: DC | PRN

## 2019-04-05 MED ORDER — MELOXICAM 7.5 MG PO TABS: 7.5000 mg | ORAL_TABLET | Freq: Every day | ORAL | 1 refills | Status: DC

## 2019-04-05 NOTE — Assessment & Plan Note (Signed)
con't with mobic and brace F/u ortho

## 2019-04-05 NOTE — Progress Notes (Signed)
Patient ID: Ernest Sanders, male    DOB: Nov 08, 1945  Age: 74 y.o. MRN: [id number]    Subjective:  Subjective  HPI Ernest Sanders presents for f/u insomnia and R shoulder pain Pt is seeing ortho for his shoulder but he needs a refill on his mobic.  [place]   Pt sees cardiology for bp / pac-       Review of Systems  Constitutional: Negative for appetite change, diaphoresis, fatigue and unexpected weight change.  Eyes: Negative for pain, redness and visual disturbance.  Respiratory: Negative for cough, chest tightness, shortness of breath and wheezing.   Cardiovascular: Negative for chest pain, palpitations and leg swelling.  Endocrine: Negative for cold intolerance, heat intolerance, polydipsia, polyphagia and polyuria.  Genitourinary: Negative for difficulty urinating, dysuria and frequency.  Musculoskeletal: Positive for arthralgias. Negative for joint swelling.  Neurological: Negative for dizziness, light-headedness, numbness and headaches.    History Past Medical History:  Diagnosis Date  . 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 Anxiety   . Arthritis   . Asthma    slight  . Atrial tachycardia ([place]) 05/20/2018  . Atrophy of muscle of both hands 08/05/2017  . DDD (degenerative disc disease), cervical 08/05/2017  . Depression   . Enlarged prostate   . GERD (gastroesophageal reflux disease)   . History of asthma 08/05/2017  . History of gastroesophageal reflux (GERD) 08/05/2017  . HTN (hypertension)   . Multiple allergies   . PAC (premature atrial contraction) 05/20/2018  . Primary osteoarthritis of both hands 08/05/2017  . Recurrent infections 08/05/2017  . [facility] spotted fever) 11/08/2016  . Shortness of breath dyspnea     He has a past surgical history that includes Umbilical hernia repair (2002); Inguinal hernia repair (Bilateral, 11/21/2015); Insertion of mesh (Bilateral, 11/21/2015); and Lipoma excision (N/A, 11/21/2015).    His family history includes Arthritis in an other family member; Diabetes in his mother; Heart disease in his father; Hypertension in his mother; Stroke in his paternal grandmother.He reports that he quit smoking about 37 years ago. His smoking use included pipe and cigars. He has never used smokeless tobacco. He reports that he does not drink alcohol or use drugs.  Current Outpatient Medications on File Prior to Visit  Medication Sig Dispense Refill  . aspirin 81 MG tablet Take 81 mg by mouth daily.    . cetirizine (ZYRTEC) 10 MG tablet Take 10 mg by mouth daily.    Marland Kitchen EPINEPHrine 0.3 mg/0.3 mL IJ SOAJ injection Inject 0.3 mLs into the muscle as needed (allergic reaction).   1  . fluticasone (FLONASE) 50 MCG/ACT nasal spray SHAKE LQ AND U 1 TO 2 SPRAYS IEN QD  6  . Multiple Vitamin (MULTIVITAMIN) tablet Take 1 tablet by mouth daily.    . pravastatin (PRAVACHOL) 20 MG tablet TAKE 1 TABLET BY MOUTH EVERY EVENING AFTER DINNER 30 tablet 3  . valsartan-hydrochlorothiazide (DIOVAN-HCT) 160-12.5 MG tablet Take 1 tablet by mouth every morning. 90 tablet 0  . verapamil (CALAN-SR) 120 MG CR tablet Take 120 mg by mouth at bedtime.    . verapamil (VERELAN PM) 120 MG 24 hr capsule TAKE 1 CAPSULE BY MOUTH EVERY MORNING 90 capsule 3   No current facility-administered medications on file prior to visit.     Objective:  Objective  Physical Exam Vitals and nursing note reviewed.  Constitutional:      General: He is sleeping.  Appearance: He is well-developed.  HENT:     Head: Normocephalic and atraumatic.  Eyes:     Pupils: Pupils are equal, round, and reactive to light.  Neck:     Thyroid: No thyromegaly.  Cardiovascular:     Rate and Rhythm: Normal rate and regular rhythm.     Heart sounds: No murmur.  Pulmonary:     Effort: Pulmonary effort is normal. No respiratory distress.     Breath sounds: Normal breath sounds. No wheezing or rales.  Chest:     Chest wall: No tenderness.   Musculoskeletal:     Right shoulder: Tenderness present. Decreased range of motion.       Arms:     Cervical back: Normal range of motion and neck supple.  Skin:    General: Skin is warm and dry.  Neurological:     Mental Status: He is oriented to person, place, and time.  Psychiatric:        Behavior: Behavior normal.        Thought Content: Thought content normal.        Judgment: Judgment normal.    BP 136/73 (BP Location: Left Arm, Cuff Size: Normal)   Pulse (!) 42   Temp (!) 96.8 F (36 C) (Temporal)   Resp 12   Ht 5' 9.5" (1.765 m)   Wt 168 lb 9.6 oz (76.5 kg)   SpO2 95%   BMI 24.54 kg/m  Wt Readings from Last 3 Encounters:  04/05/19 168 lb 9.6 oz (76.5 kg)  11/23/18 164 lb (74.4 kg)  05/20/18 164 lb 9.6 oz (74.7 kg)     Lab Results  Component Value Date   WBC 6.8 05/21/2017   HGB 15.3 05/21/2017   HCT 44.2 05/21/2017   PLT 219.0 05/21/2017   GLUCOSE 119 (H) 05/21/2017   CHOL 172 05/11/2018   TRIG 89 05/11/2018   HDL 63 05/11/2018   LDLCALC 91 05/11/2018   ALT 14 05/21/2017   AST 26 05/21/2017   NA 143 05/21/2017   K 4.7 05/21/2017   CL 107 05/21/2017   CREATININE 1.10 05/21/2017   BUN 20 05/21/2017   CO2 28 05/21/2017   TSH 4.34 06/25/2017    No results found.   Assessment & Plan:  Plan  I have discontinued Da Beisel. Knoble's chlordiazePOXIDE. I am also having him start on meloxicam and traZODone. Additionally, I am having him maintain his multivitamin, EPINEPHrine, fluticasone, cetirizine, aspirin, verapamil, verapamil, pravastatin, and valsartan-hydrochlorothiazide.  Meds ordered this encounter  Medications  . meloxicam (MOBIC) 7.5 MG tablet    Sig: Take 1 tablet (7.5 mg total) by mouth daily.    Dispense:  90 tablet    Refill:  1  . traZODone (DESYREL) 50 MG tablet    Sig: Take 0.5-1 tablets (25-50 mg total) by mouth at bedtime as needed for sleep.    Dispense:  30 tablet    Refill:  3    Problem List Items Addressed This Visit       Unprioritized   Acute pain of right shoulder - Primary    con't with mobic and brace F/u ortho      Relevant Medications   meloxicam (MOBIC) 7.5 MG tablet   Insomnia    trazadone 50 mg 1/2 -1 po qhs prn sleep      Relevant Medications   traZODone (DESYREL) 50 MG tablet      Follow-up: Return in 6 months (on 10/03/2019), or if symptoms worsen or fail  to improve, for annual exam, fasting.  Ann Held, DO

## 2019-04-05 NOTE — Assessment & Plan Note (Signed)
trazadone 50 mg 1/2 -1 po qhs prn sleep

## 2019-04-05 NOTE — Patient Instructions (Signed)
COVID-19 Vaccine Information can be found at: [email or web address].uk For questions related to vaccine distribution or appointments, please email [email or web address] or call [phone].      Shoulder Pain Many things can cause shoulder pain, including:  An injury to the shoulder.  Overuse of the shoulder.  Arthritis. The source of the pain can be:  Inflammation.  An injury to the shoulder joint.  An injury to a tendon, ligament, or bone. Follow these instructions at home: Pay attention to changes in your symptoms. Let your health care provider know about them. Follow these instructions to relieve your pain. If you have a sling:  Wear the sling as told by your health care provider. Remove it only as told by your health care provider.  Loosen the sling if your fingers tingle, become numb, or turn cold and blue.  Keep the sling clean.  If the sling is not waterproof: ? Do not let it get wet. Remove it to shower or bathe.  Move your arm as little as possible, but keep your hand moving to prevent swelling. Managing pain, stiffness, and swelling   If directed, put ice on the painful area: ? Put ice in a plastic bag. ? Place a towel between your skin and the bag. ? Leave the ice on for 20 minutes, 2-3 times per day. Stop applying ice if it does not help with the pain.  Squeeze a soft ball or a foam pad as much as possible. This helps to keep the shoulder from swelling. It also helps to strengthen the arm. General instructions  Take over-the-counter and prescription medicines only as told by your health care provider.  Keep all follow-up visits as told by your health care provider. This is important. Contact a health care provider if:  Your pain gets worse.  Your pain is not relieved with medicines.  New pain develops in your arm, hand, or fingers. Get help right away if:  Your arm, hand, or  fingers: ? Tingle. ? Become numb. ? Become swollen. ? Become painful. ? Turn white or blue. Summary  Shoulder pain can be caused by an injury, overuse, or arthritis.  Pay attention to changes in your symptoms. Let your health care provider know about them.  This condition may be treated with a sling, ice, and pain medicines.  Contact your health care provider if the pain gets worse or new pain develops. Get help right away if your arm, hand, or fingers tingle or become numb, swollen, or painful.  Keep all follow-up visits as told by your health care provider. This is important. This information is not intended to replace advice given to you by your health care provider. Make sure you discuss any questions you have with your health care provider. Document Revised: 09/02/2017 Document Reviewed: 09/02/2017 Elsevier Patient Education  [place].

## 2019-04-07 ENCOUNTER — Ambulatory Visit (INDEPENDENT_AMBULATORY_CARE_PROVIDER_SITE_OTHER): Payer: Federal, State, Local not specified - PPO

## 2019-04-07 ENCOUNTER — Ambulatory Visit: Payer: Federal, State, Local not specified - PPO | Admitting: Podiatry

## 2019-04-07 ENCOUNTER — Other Ambulatory Visit: Payer: Self-pay

## 2019-04-07 DIAGNOSIS — M2041 Other hammer toe(s) (acquired), right foot: Secondary | ICD-10-CM

## 2019-04-07 DIAGNOSIS — G5791 Unspecified mononeuropathy of right lower limb: Secondary | ICD-10-CM

## 2019-04-07 MED ORDER — GABAPENTIN 100 MG PO CAPS: 100.0000 mg | ORAL_CAPSULE | Freq: Every day | ORAL | 2 refills | Status: DC

## 2019-04-11 NOTE — Progress Notes (Signed)
   HPI: 74 y.o. male presenting today as a new patient with a chief complaint of pain noted to the 2nd digit of the right foot that has been ongoing for the past 14 years. He states the pain is intermittent and feels like an "electrical shock". He reports associated swelling. Resting the foot at night causes the pain to occur. He has been taking OTC pain medication for treatment. Patient is here for further evaluation and treatment.   Past Medical History:  Diagnosis Date  . 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 Anxiety   . Arthritis   . Asthma    slight  . Atrial tachycardia ([place]) 05/20/2018  . Atrophy of muscle of both hands 08/05/2017  . DDD (degenerative disc disease), cervical 08/05/2017  . Depression   . Enlarged prostate   . GERD (gastroesophageal reflux disease)   . History of asthma 08/05/2017  . History of gastroesophageal reflux (GERD) 08/05/2017  . HTN (hypertension)   . Multiple allergies   . PAC (premature atrial contraction) 05/20/2018  . Primary osteoarthritis of both hands 08/05/2017  . Recurrent infections 08/05/2017  . [facility] spotted fever) 11/08/2016  . Shortness of breath dyspnea      Physical Exam: General: The patient is alert and oriented x3 in no acute distress.  Dermatology: Skin is warm, dry and supple bilateral lower extremities. Negative for open lesions or macerations.  Vascular: Palpable pedal pulses bilaterally. No edema or erythema noted. Capillary refill within normal limits.  Neurological: Epicritic and protective threshold intact.    Musculoskeletal Exam: Range of motion within normal limits to all pedal and ankle joints bilateral. Muscle strength 5/5 in all groups bilateral.   Radiographic Exam: Degenerative changes with joint space narrowing noted to the 2nd MPJ of the right foot.     Assessment: 1. 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 Neuritis right 2nd toe - nocturnal    Plan of Care:  1. Patient evaluated. X-Rays reviewed.  2.  Prescription for Gabapentin 100 mg QHS provided to patient.  3. Recommended good shoe gear.  4. Return to clinic in 4 weeks.       Edrick Kins, DPM [facility]  Dr. Edrick Kins, DPM    [address], [place] [address]                Office [phone]  Fax [phone]

## 2019-04-20 ENCOUNTER — Telehealth: Payer: Self-pay

## 2019-04-20 NOTE — Telephone Encounter (Signed)
Pt. Informed of Dr. Rebekah Chesterfield suggestion/instructions. Pt stated understanding and is looking forward to his sx consultation.

## 2019-04-20 NOTE — Telephone Encounter (Signed)
We will reassess and discuss possible surgery next visit. - Dr. Amalia Hailey

## 2019-04-20 NOTE — Telephone Encounter (Signed)
Pt called stating he is still compliance with the Gabapentin 100mg  but is not helping with his problem. Pt states is requesting further instructions from Dr. Amalia Hailey for a,  "permanent solution and not a drug solution."

## 2019-05-10 ENCOUNTER — Other Ambulatory Visit: Payer: Self-pay

## 2019-05-10 ENCOUNTER — Ambulatory Visit: Payer: Federal, State, Local not specified - PPO | Admitting: Podiatry

## 2019-05-10 DIAGNOSIS — L989 Disorder of the skin and subcutaneous tissue, unspecified: Secondary | ICD-10-CM | POA: Diagnosis not present

## 2019-05-10 DIAGNOSIS — L84 Corns and callosities: Secondary | ICD-10-CM | POA: Diagnosis not present

## 2019-05-10 DIAGNOSIS — G5791 Unspecified mononeuropathy of right lower limb: Secondary | ICD-10-CM

## 2019-05-10 DIAGNOSIS — M2041 Other hammer toe(s) (acquired), right foot: Secondary | ICD-10-CM

## 2019-05-10 MED ORDER — CELECOXIB 100 MG PO CAPS: 100.0000 mg | ORAL_CAPSULE | Freq: Two times a day (BID) | ORAL | 1 refills | Status: DC

## 2019-05-13 NOTE — Progress Notes (Signed)
   HPI: 74 y.o. male presenting today for follow up evaluation of right foot pain. He states he is still experiencing pain and it has not changed since his last visit. He states the Gabapentin has not helped alleviate his symptoms.  He also has a new complaint of a sore lesion between the 4th and 5th digits of the right foot that appeared about 3 weeks ago. Wearing certain shoes increases the pain. He reports having an injection in the area for treatment by Dr. Gershon Mussel. Patient is here for further evaluation and treatment.   Past Medical History:  Diagnosis Date  . 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 Anxiety   . Arthritis   . Asthma    slight  . Atrial tachycardia ([place]) 05/20/2018  . Atrophy of muscle of both hands 08/05/2017  . DDD (degenerative disc disease), cervical 08/05/2017  . Depression   . Enlarged prostate   . GERD (gastroesophageal reflux disease)   . History of asthma 08/05/2017  . History of gastroesophageal reflux (GERD) 08/05/2017  . HTN (hypertension)   . Multiple allergies   . PAC (premature atrial contraction) 05/20/2018  . Primary osteoarthritis of both hands 08/05/2017  . Recurrent infections 08/05/2017  . RMS[facility] spotted fever) 11/08/2016  . Shortness of breath dyspnea      Physical Exam: General: The patient is alert and oriented x3 in no acute distress.  Dermatology: Hyperkeratotic tissue noted to the 4th interdigital webspace of the right foot. Skin is warm, dry and supple bilateral lower extremities. Negative for open lesions or macerations.  Vascular: Palpable pedal pulses bilaterally. No edema or erythema noted. Capillary refill within normal limits.  Neurological: Epicritic and protective threshold intact.    Musculoskeletal Exam: Range of motion within normal limits to all pedal and ankle joints bilateral. Muscle strength 5/5 in all groups bilateral.   Assessment: 1. 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 Neuritis right 2nd toe - nocturnal  3. [place]  molle right    Plan of Care:  1. Patient evaluated. 2. Discontinue taking Gabapentin every night at bedtime.  3. Prescription for Celebrex 100 mg twice daily.  4. Excisional debridement of keratotic lesion(s) using a chisel blade was performed without incident. Light dressing applied.  5. Return to clinic in 3 weeks. If not better, we will discuss surgery.       Edrick Kins, DPM [facility]  Dr. Edrick Kins, DPM    [address], [place] [address]                Office [phone]  Fax [phone]

## 2019-05-17 ENCOUNTER — Other Ambulatory Visit: Payer: Self-pay

## 2019-05-17 ENCOUNTER — Ambulatory Visit: Payer: Federal, State, Local not specified - PPO | Admitting: Cardiology

## 2019-05-17 ENCOUNTER — Encounter: Payer: Self-pay | Admitting: Cardiology

## 2019-05-17 VITALS — BP 160/76 | HR 72 | Temp 97.2°F | Resp 16 | Ht 69.5 in | Wt 168.0 lb

## 2019-05-17 DIAGNOSIS — E785 Hyperlipidemia, unspecified: Secondary | ICD-10-CM

## 2019-05-17 DIAGNOSIS — R002 Palpitations: Secondary | ICD-10-CM

## 2019-05-17 DIAGNOSIS — I10 Essential (primary) hypertension: Secondary | ICD-10-CM

## 2019-05-17 NOTE — Progress Notes (Signed)
Primary Physician/Referring:  Ann Held, DO  Patient ID: Ernest Sanders, male    DOB: February 04, 1946, 74 y.o.   MRN: [id number]  Chief Complaint  Patient presents with  . Palpitations  . Bradycardia  . Hypertension  . Follow-up    1 Year   HPI:    Ernest Sanders  is a 74 y.o.  Caucasian male with hypertension, asymptomatic sinus bradycardia, frequent PVCs and atrial bigeminy on the EKG, mild hyperlipidemia with abnormal nuclear stress test in December 2019, in the form of reduced LVEF although echocardiogram was normal, was started on ARB and low-dose statins for mild hyperlipidemia presents here for annual visit.  He is tolerating all his medications well and states that he has no specific complaints today.  Except for arthritis that is diffuse, for which she was taking NSAIDs frequently, states that he is doing well.  Since being on Celebrex states that he stopped all other NSAIDs and pain is much improved.  Past Medical History:  Diagnosis Date  . 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 Anxiety   . Arthritis   . Asthma    slight  . Atrial tachycardia ([place]) 05/20/2018  . Atrophy of muscle of both hands 08/05/2017  . DDD (degenerative disc disease), cervical 08/05/2017  . Depression   . Enlarged prostate   . GERD (gastroesophageal reflux disease)   . History of asthma 08/05/2017  . History of gastroesophageal reflux (GERD) 08/05/2017  . HTN (hypertension)   . Multiple allergies   . PAC (premature atrial contraction) 05/20/2018  . Primary osteoarthritis of both hands 08/05/2017  . Recurrent infections 08/05/2017  . RMS[facility] spotted fever) 11/08/2016  . Shortness of breath dyspnea    Past Surgical History:  Procedure Laterality Date  . INGUINAL HERNIA REPAIR Bilateral 11/21/2015   Procedure: OPEN BILATERAL INGUINAL HERNIA REPAIR;  Surgeon: [name], MD;  Location: [facility];  Service: General;  Laterality: Bilateral;  . INSERTION OF MESH Bilateral 11/21/2015     Procedure: INSERTION OF MESH;  Surgeon: [name], MD;  Location: [facility];  Service: General;  Laterality: Bilateral;  . LIPOMA EXCISION N/A 11/21/2015   Procedure: EXCISION TWO ABDOMINAL WALL LIPOMAS;  Surgeon: [name], MD;  Location: [facility];  Service: General;  Laterality: N/A;  . UMBILICAL HERNIA REPAIR  2002   Family History  Problem Relation Age of Onset  . Heart disease Father        CHF  . Hypertension Mother   . Diabetes Mother        Surgical  . Arthritis Other   . Stroke Paternal Grandmother     Social History   Tobacco Use  . Smoking status: Former Smoker    Types: Pipe, Cigars    Quit date: 1984    Years since quitting: 37.2  . Smokeless tobacco: Never Used  Substance Use Topics  . Alcohol use: No    Comment: Recovering, November 1972   ROS  Review of Systems  Cardiovascular: Negative for chest pain, dyspnea on exertion and leg swelling.  Musculoskeletal: Positive for arthritis (diffuse osteo arthritis).  Gastrointestinal: Negative for melena.   Objective  Blood pressure (!) 160/76, pulse 72, temperature (!) 97.2 F (36.2 C), temperature source Temporal, resp. rate 16, height 5' 9.5" (1.765 m), weight 168 lb (76.2 kg), SpO2 96 %.  Vitals with BMI 05/17/2019 04/05/2019 11/23/2018  Height 5' 9.5" 5' 9.5" 5' 10"   Weight 168 lbs 168 lbs  10 oz 164 lbs  BMI 24.46 77.11 65.79  Systolic 038 333 832  Diastolic 76 73 74  Pulse 72 42 -     Physical Exam  Cardiovascular: Normal rate, regular rhythm, normal heart sounds and intact distal pulses. Frequent extrasystoles are present. Exam reveals no gallop.  No murmur heard. No leg edema, no JVD.  Pulmonary/Chest: Effort normal and breath sounds normal.  Abdominal: Soft. Bowel sounds are normal.   Laboratory examination:   No results for input(s): NA, K, CL, CO2, GLUCOSE, BUN, CREATININE, CALCIUM, GFRNONAA, GFRAA in the last 8760 hours. CrCl cannot be calculated (Patient's most recent lab result is older than  the maximum 21 days allowed.).  CMP Latest Ref Rng & Units 05/21/2017 10/10/2016 11/14/2015  Glucose 70 - 99 mg/dL 119(H) 98 131(H)  BUN 6 - 23 mg/dL 20 16 12   Creatinine 0.40 - 1.50 mg/dL 1.10 1.10 0.97  Sodium 135 - 145 mEq/L 143 141 140  Potassium 3.5 - 5.1 mEq/L 4.7 4.9 4.1  Chloride 96 - 112 mEq/L 107 106 110  CO2 19 - 32 mEq/L 28 31 24   Calcium 8.4 - 10.5 mg/dL 9.9 10.2 9.2  Total Protein 6.0 - 8.3 g/dL 7.1 7.6 7.1  Total Bilirubin 0.2 - 1.2 mg/dL 0.2 0.5 0.6  Alkaline Phos 39 - 117 U/L 48 53 42  AST 0 - 37 U/L 26 26 31   ALT 0 - 53 U/L 14 16 21    CBC Latest Ref Rng & Units 05/21/2017 10/10/2016 11/14/2015  WBC 4.0 - 10.5 K/uL 6.8 7.0 5.0  Hemoglobin 13.0 - 17.0 g/dL 15.3 15.8 15.9  Hematocrit 39.0 - 52.0 % 44.2 47.6 47.1  Platelets 150.0 - 400.0 K/uL 219.0 233.0 195   Lipid Panel     Component Value Date/Time   CHOL 172 05/11/2018 0819   TRIG 89 05/11/2018 0819   HDL 63 05/11/2018 0819   LDLCALC 91 05/11/2018 0819   HEMOGLOBIN A1C No results found for: HGBA1C, MPG TSH No results for input(s): TSH in the last 8760 hours.  Medications and allergies   Allergies  Allergen Reactions  . 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 Lactose Intolerance (Gi) Diarrhea    Bloating,gas     Current Outpatient Medications  Medication Instructions  . aspirin 81 mg, Oral, Daily  . celecoxib (CELEBREX) 100 mg, Oral, 2 times daily  . cetirizine (ZYRTEC) 10 mg, Oral, Daily  . EPINEPHrine 0.3 mg/0.3 mL IJ SOAJ injection 0.3 mLs, Intramuscular, As needed  . fluticasone (FLONASE) 50 MCG/ACT nasal spray SHAKE LQ AND U 1 TO 2 SPRAYS IEN QD  . Multiple Vitamin (MULTIVITAMIN) tablet 1 tablet, Oral, Daily  . pravastatin (PRAVACHOL) 20 MG tablet TAKE 1 TABLET BY MOUTH EVERY EVENING AFTER DINNER  . valsartan-hydrochlorothiazide (DIOVAN-HCT) 160-12.5 MG tablet 1 tablet, Oral, BH-each morning  . verapamil (CALAN-SR) 120 mg, Oral, Daily at bedtime  . verapamil (VERELAN PM) 120 MG 24 hr capsule  TAKE 1 CAPSULE BY MOUTH EVERY MORNING   Radiology:   No results found.  Cardiac Studies:   Exercise sestamibi stress test 02/23/2018:  1. The patient performed treadmill exercise using Bruce protocol, completing 6:30 minutes. The patient completed an estimated workload of 6.5 METS, reaching 109% of the maximum predicted heart rate. Resting hypertension 164/90 mmHg, with exaggerated exercise response seen with peak BP reaching 210/72 mmHg. Exercise capacity was low normal. No stress symptoms reported. 2. The overall quality of the study is good. Left ventricular cavity is noted  to be enlarged on the rest and stress studies. Gated SPECT images reveal global decrease in myocardial thickening and wall motion. The left ventricular ejection fraction was calculated or visually estimated to be 38%. Small sized, mild intensity, reversible perfusion defect in apical inferolateral myocardium. 3. High risk study due to reduced LVEF and regional perfusion defect.   Echocardiogram 03/03/2018:  Left ventricle cavity is normal in size. Mild  hypertrophy of the left ventricle. Normal global wall motion. Frequent premature beats are seen throughout the test. Calculated EF 64%.  2. Left atrial cavity is mildly dilated. 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  3. Mild (Grade I) aortic regurgitation. 4. Mild (Grade I) mitral regurgitation. 5. Mild tricuspid regurgitation. No evidence of pulmonary hypertension.  EKG  EKG 05/17/2019: Normal sinus rhythm at rate 69 bpm, left atrial enlargement, normal axis, nonspecific T wave flattening. # PACs = 2.  No significant change from EKG 05/20/2018   Assessment     ICD-10-CM   1. Palpitations  R00.2 EKG 12-Lead    TSH  2. Primary hypertension  I10 CBC    CMP14+EGFR    TSH  3. Mild hyperlipidemia  E78.5 Lipid Panel With LDL/HDL Ratio     No orders of the defined types were placed in this encounter.   Medications  Discontinued During This Encounter  Medication Reason  . gabapentin (NEURONTIN) 100 MG capsule Change in therapy  . meloxicam (MOBIC) 7.5 MG tablet Change in therapy  . traZODone (DESYREL) 50 MG tablet Discontinued by provider    Recommendations:   Ernest Sanders  is a 74 y.o.  Caucasian male with hypertension, asymptomatic sinus bradycardia, frequent PVCs and atrial bigeminy on the EKG, mild hyperlipidemia with abnormal nuclear stress test in December 2019, in the form of reduced LVEF although echocardiogram was normal, was started on ARB and low-dose statins for mild hyperlipidemia presents here for annual visit.   Although he continues to have frequent PACs, no significant palpitations, essentially remains asymptomatic, would not pursue any further evaluation for the same.  I have reviewed the labs again with the patient along with stress test and echocardiogram, reassured him.  I suspect decreased LVEF may be related to frequent PACs noted on EKG.  His blood pressure is markedly better today, he has not been checking his blood pressure closely, he would benefit from chronic care management.  He prefers to wait on making changes to his medications, we will continue to follow the score along with our pharmacist.  With regard to Celebrex, this may have contributed to elevated blood pressure but I am not completely sure.  Patient is completely stopped all other NSAIDs for his "arthritis".  Advised him that he could try taking Celebrex once a day and see if this makes any impact.  Would like to see him back in 6 weeks for follow-up of hypertension.  With regard to hyperlipidemia, he is on low-dose statins, he does not have recent lipids, I will obtain labs for follow-up.  Forward a copy of the labs to Dr. Garnet Koyanagi. Adrian Prows, MD, [facility] 05/17/2019, 2:57 PM [facility] [place] Office: [phone]

## 2019-06-06 ENCOUNTER — Other Ambulatory Visit: Payer: Self-pay | Admitting: Cardiology

## 2019-06-07 ENCOUNTER — Ambulatory Visit: Payer: Federal, State, Local not specified - PPO | Admitting: Podiatry

## 2019-06-07 ENCOUNTER — Other Ambulatory Visit: Payer: Self-pay

## 2019-06-07 VITALS — Temp 97.3°F

## 2019-06-07 DIAGNOSIS — M2041 Other hammer toe(s) (acquired), right foot: Secondary | ICD-10-CM | POA: Diagnosis not present

## 2019-06-07 DIAGNOSIS — G5791 Unspecified mononeuropathy of right lower limb: Secondary | ICD-10-CM

## 2019-06-10 NOTE — Progress Notes (Signed)
   HPI: 74 y.o. male presenting today for follow up evaluation of right foot pain. He states she is doing better. He reports his pain is affected by the weather. He was instructed by his cardiologist to take the Celebrex only once daily. Patient is here for further evaluation and treatment.   Past Medical History:  Diagnosis Date  . 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 Anxiety   . Arthritis   . Asthma    slight  . Atrial tachycardia ([place]) 05/20/2018  . Atrophy of muscle of both hands 08/05/2017  . DDD (degenerative disc disease), cervical 08/05/2017  . Depression   . Enlarged prostate   . GERD (gastroesophageal reflux disease)   . History of asthma 08/05/2017  . History of gastroesophageal reflux (GERD) 08/05/2017  . HTN (hypertension)   . Multiple allergies   . PAC (premature atrial contraction) 05/20/2018  . Primary osteoarthritis of both hands 08/05/2017  . Recurrent infections 08/05/2017  . 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 Shortness of breath dyspnea      Physical Exam: General: The patient is alert and oriented x3 in no acute distress.  Dermatology: Hyperkeratotic tissue noted to the 4th interdigital webspace of the right foot. Skin is warm, dry and supple bilateral lower extremities. Negative for open lesions or macerations.  Vascular: Palpable pedal pulses bilaterally. No edema or erythema noted. Capillary refill within normal limits.  Neurological: Epicritic and protective threshold intact.    Musculoskeletal Exam: Range of motion within normal limits to all pedal and ankle joints bilateral. Muscle strength 5/5 in all groups bilateral.   Assessment: 1. 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 Neuritis right 2nd toe - nocturnal     Plan of Care:  1. Patient evaluated. 2. Continue taking Celebrex as needed.  3. Recommended good shoe gear.  4. Continue conservative treatment at this time.  5. Return to clinic as needed if patient needs surgery / toe joint  replacement.       Edrick Kins, DPM [facility]  Dr. Edrick Kins, DPM    [address], [place] [address]                Office [phone]  Fax [phone]

## 2019-06-11 ENCOUNTER — Telehealth: Payer: Self-pay | Admitting: Podiatry

## 2019-06-11 NOTE — Telephone Encounter (Signed)
My guarantor number is [id number]. Would like to get a clarification on the bill that is supposed to be due here $150. Thank you. Bye.

## 2019-06-18 ENCOUNTER — Telehealth: Payer: Self-pay | Admitting: Podiatry

## 2019-06-18 NOTE — Telephone Encounter (Signed)
My guarantor account number is [id number] and I spoke to a representative last Friday about this $150 bill. She said she would try to take care of this bill on Monday and I'm disputing it because there really was not a procedure done. It was just, it was nothing. I would like someone to get back with me with a resolution. Thank you.

## 2019-06-18 NOTE — Telephone Encounter (Signed)
This is Ernest Sanders Training and development officer. Still trying to get a resolution on account number [id number]. They were going to get back with me on Monday to see if they could resolve the issue. I've not heard from anyone. Looking for a call. Thank you very much.

## 2019-06-18 NOTE — Telephone Encounter (Signed)
This is Rob Training and development officer again. I wanted to get back with you as soon as possible. They were going to get back with me Monday but I haven't heard anything and this is concerning a $150 bill. Anyway, talk to you soon. Thank you. Bye.

## 2019-06-22 LAB — CMP14+EGFR
ALT: 18 IU/L (ref 0–44)
AST: 30 IU/L (ref 0–40)
Albumin/Globulin Ratio: 1.9 (ref 1.2–2.2)
Albumin: 4.2 g/dL (ref 3.7–4.7)
Alkaline Phosphatase: 71 IU/L (ref 39–117)
BUN/Creatinine Ratio: 18 (ref 10–24)
BUN: 19 mg/dL (ref 8–27)
Bilirubin Total: 0.4 mg/dL (ref 0.0–1.2)
CO2: 23 mmol/L (ref 20–29)
Calcium: 9.4 mg/dL (ref 8.6–10.2)
Chloride: 105 mmol/L (ref 96–106)
Creatinine, Ser: 1.04 mg/dL (ref 0.76–1.27)
GFR calc Af Amer: 83 mL/min/{1.73_m2} (ref >59)
GFR calc non Af Amer: 71 mL/min/{1.73_m2} (ref >59)
Globulin, Total: 2.2 g/dL (ref 1.5–4.5)
Glucose: 96 mg/dL (ref 65–99)
Potassium: 4.3 mmol/L (ref 3.5–5.2)
Sodium: 141 mmol/L (ref 134–144)
Total Protein: 6.4 g/dL (ref 6.0–8.5)

## 2019-06-22 LAB — LIPID PANEL WITH LDL/HDL RATIO
Cholesterol, Total: 172 mg/dL (ref 100–199)
HDL: 62 mg/dL (ref >39)
LDL Chol Calc (NIH): 95 mg/dL (ref 0–99)
LDL/HDL Ratio: 1.5 ratio (ref 0.0–3.6)
Triglycerides: 82 mg/dL (ref 0–149)
VLDL Cholesterol Cal: 15 mg/dL (ref 5–40)

## 2019-06-22 LAB — TSH: TSH: 3.82 u[IU]/mL (ref 0.450–4.500)

## 2019-06-22 LAB — CBC
Hematocrit: 41.5 % (ref 37.5–51.0)
Hemoglobin: 14.7 g/dL (ref 13.0–17.7)
MCH: 32.5 pg (ref 26.6–33.0)
MCHC: 35.4 g/dL (ref 31.5–35.7)
MCV: 92 fL (ref 79–97)
Platelets: 203 10*3/uL (ref 150–450)
RBC: 4.53 x10E6/uL (ref 4.14–5.80)
RDW: 11.7 % (ref 11.6–15.4)
WBC: 5.2 10*3/uL (ref 3.4–10.8)

## 2019-06-24 ENCOUNTER — Telehealth: Payer: Self-pay | Admitting: Pharmacist

## 2019-06-24 NOTE — Telephone Encounter (Signed)
Called pt for PCM/RPM follow up. BP readings reviewed. BP stable and at goal. Pt states that he doing well overall. Does have some days where he feels tired and fatigued but attributes it to all the house and yard he has been doing. Pt does have leg pain and is managing it with either celebrex, ibuprofen, tylenol, or meloxicam. Pt aware to avoid taking multiple NSAIDs together. Pt alternates between celebrix, ibuprofen and tylenol currently; meloxicam on hold and pt repots that he hast used it since picking it up. Currently only using celebrex once a day (previously taking twice a day) and ibuprofen nightly. States that tylenol hasnt been able to give him much pain relief and that ibuprofen provides better management. Pt aware that it may potentially increase his risk of GI bleeds and increase his BP with continued concomitant use. States that he has been actively trying to eat healthier and using better heart healthy ingredients. Started using olive oil and smart balance butter. Reports to be taking his medications as prescribed and denies any ADRs. No change recommended at this time. WIll continue to monitor and follow up as needed. Med list reviewed and updated.

## 2019-07-02 NOTE — Progress Notes (Signed)
Primary Physician/Referring:  Ann Held, DO  Patient ID: Ernest Sanders, male    DOB: 1945-04-04, 74 y.o.   MRN: [id number]  Chief Complaint  Patient presents with  . Follow-up  . Hypertension   HPI:    Ernest Sanders  is a 74 y.o.  Caucasian male with hypertension, asymptomatic sinus bradycardia, frequent PVCs and atrial bigeminy on the EKG, mild hyperlipidemia with abnormal nuclear stress test in December 2019, in the form of reduced LVEF although echocardiogram was normal, was started on ARB and low-dose statins for mild hyperlipidemia presents here for 6-week office visit and follow-up of hypertension andhyperlipidemia.  Due to severe degenerative joint disease, he is on Celebrex and has discontinued all other NSAIDs.  No new symptoms, states that his blood pressure has been well controlled at home.  He denies any palpitations or chest pain.  Past Medical History:  Diagnosis Date  . 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 Anxiety   . Arthritis   . Asthma    slight  . Atrial tachycardia ([place]) 05/20/2018  . Atrophy of muscle of both hands 08/05/2017  . DDD (degenerative disc disease), cervical 08/05/2017  . Depression   . Enlarged prostate   . GERD (gastroesophageal reflux disease)   . History of asthma 08/05/2017  . History of gastroesophageal reflux (GERD) 08/05/2017  . HTN (hypertension)   . Multiple allergies   . PAC (premature atrial contraction) 05/20/2018  . Primary osteoarthritis of both hands 08/05/2017  . Recurrent infections 08/05/2017  . RMSF [facility] spotted fever) 11/08/2016  . Shortness of breath dyspnea    Past Surgical History:  Procedure Laterality Date  . INGUINAL HERNIA REPAIR Bilateral 11/21/2015   Procedure: OPEN BILATERAL INGUINAL HERNIA REPAIR;  Surgeon: [name], MD;  Location: [place];  Service: General;  Laterality: Bilateral;  . INSERTION OF MESH Bilateral 11/21/2015   Procedure: INSERTION OF MESH;  Surgeon: [name], MD;   Location: [place];  Service: General;  Laterality: Bilateral;  . LIPOMA EXCISION N/A 11/21/2015   Procedure: EXCISION TWO ABDOMINAL WALL LIPOMAS;  Surgeon: [name], MD;  Location: [place];  Service: General;  Laterality: N/A;  . UMBILICAL HERNIA REPAIR  2002   Family History  Problem Relation Age of Onset  . Heart disease Father        CHF  . Hypertension Mother   . Diabetes Mother        Surgical  . Arthritis Other   . Stroke Paternal Grandmother     Social History   Tobacco Use  . Smoking status: Former Smoker    Types: Pipe, Cigars    Quit date: 1984    Years since quitting: 37.3  . Smokeless tobacco: Never Used  Substance Use Topics  . Alcohol use: No    Comment: Recovering, November 1972   ROS  Review of Systems  Cardiovascular: Negative for chest pain, dyspnea on exertion and leg swelling.  Musculoskeletal: Positive for arthritis (diffuse osteo arthritis).  Gastrointestinal: Negative for melena.   Objective  Blood pressure 132/78, pulse 60, temperature 97.8 F (36.6 C), temperature source Temporal, resp. rate 15, height 5\' 10"  (1.778 m), weight 165 lb (74.8 kg), SpO2 96 %.  Vitals with BMI 07/05/2019 05/17/2019 04/05/2019  Height 5\' 10"  5' 9.5" 5' 9.5"  Weight 165 lbs 168 lbs 168 lbs 10 oz  BMI 23.68 0000000 [id number]  Systolic [id number] 0000000 [id number]  Diastolic 78 76 73  Pulse 60  72 42     Physical Exam  Cardiovascular: Normal rate, regular rhythm, normal heart sounds and intact distal pulses. Frequent extrasystoles are present. Exam reveals no gallop.  No murmur heard. No leg edema, no JVD.  Pulmonary/Chest: Effort normal and breath sounds normal.  Abdominal: Soft. Bowel sounds are normal.   Laboratory examination:   Recent Labs    06/21/19 0932  NA 141  K 4.3  CL 105  CO2 23  GLUCOSE 96  BUN 19  CREATININE 1.04  CALCIUM 9.4  GFRNONAA 71  GFRAA 83   estimated creatinine clearance is 66.3 mL/min (by C-G formula based on SCr of 1.04 mg/dL).  CMP Latest Ref Rng &  Units 06/21/2019 05/21/2017 10/10/2016  Glucose 65 - 99 mg/dL 96 119(H) 98  BUN 8 - 27 mg/dL 19 20 16   Creatinine 0.76 - 1.27 mg/dL 1.04 1.10 1.10  Sodium 134 - 144 mmol/L 141 143 141  Potassium 3.5 - 5.2 mmol/L 4.3 4.7 4.9  Chloride 96 - 106 mmol/L 105 107 106  CO2 20 - 29 mmol/L 23 28 31   Calcium 8.6 - 10.2 mg/dL 9.4 9.9 10.2  Total Protein 6.0 - 8.5 g/dL 6.4 7.1 7.6  Total Bilirubin 0.0 - 1.2 mg/dL 0.4 0.2 0.5  Alkaline Phos 39 - 117 IU/L 71 48 53  AST 0 - 40 IU/L 30 26 26   ALT 0 - 44 IU/L 18 14 16    CBC Latest Ref Rng & Units 06/21/2019 05/21/2017 10/10/2016  WBC 3.4 - 10.8 x10E3/uL 5.2 6.8 7.0  Hemoglobin 13.0 - 17.7 g/dL 14.7 15.3 15.8  Hematocrit 37.5 - 51.0 % 41.5 44.2 47.6  Platelets 150 - 450 x10E3/uL 203 219.0 233.0   Lipid Panel     Component Value Date/Time   CHOL 172 06/21/2019 0932   TRIG 82 06/21/2019 0932   HDL 62 06/21/2019 0932   LDLCALC 95 06/21/2019 0932   HEMOGLOBIN A1C No results found for: HGBA1C, MPG TSH Recent Labs    06/21/19 0932  TSH 3.820   Medications and allergies   Allergies  Allergen Reactions  . 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 Lactose Intolerance (Gi) Diarrhea    Bloating,gas     Current Outpatient Medications  Medication Instructions  . aspirin 81 mg, Oral, Daily  . celecoxib (CELEBREX) 100 mg, Oral, 2 times daily  . cetirizine (ZYRTEC) 10 mg, Oral, Daily  . EPINEPHrine 0.3 mg/0.3 mL IJ SOAJ injection 0.3 mLs, Intramuscular, As needed  . fluticasone (FLONASE) 50 MCG/ACT nasal spray SHAKE LQ AND U 1 TO 2 SPRAYS IEN QD  . ibuprofen (ADVIL) 200 mg, Oral, Every 6 hours PRN, Taking PRN for leg pain   . Multiple Vitamin (MULTIVITAMIN) tablet 1 tablet, Oral, Daily  . pravastatin (PRAVACHOL) 20 MG tablet TAKE 1 TABLET BY MOUTH EVERY EVENING AFTER DINNER  . valsartan-hydrochlorothiazide (DIOVAN-HCT) 160-12.5 MG tablet 1 tablet, Oral, BH-each morning  . verapamil (VERELAN PM) 120 MG 24 hr capsule TAKE 1 CAPSULE BY MOUTH EVERY  MORNING  . vitamin C (ASCORBIC ACID) 500 mg, Oral, 2 times daily, 1 in the morning and 1 in the evening    Radiology:   No results found.  Cardiac Studies:   Exercise sestamibi stress test 02/23/2018:  1. The patient performed treadmill exercise using Bruce protocol, completing 6:30 minutes. The patient completed an estimated workload of 6.5 METS, reaching 109% of the maximum predicted heart rate. Resting hypertension 164/90 mmHg, with exaggerated exercise response seen with peak BP  reaching 210/72 mmHg. Exercise capacity was low normal. No stress symptoms reported. 2. The overall quality of the study is good. Left ventricular cavity is noted to be enlarged on the rest and stress studies. Gated SPECT images reveal global decrease in myocardial thickening and wall motion. The left ventricular ejection fraction was calculated or visually estimated to be 38%. Small sized, mild intensity, reversible perfusion defect in apical inferolateral myocardium. 3. High risk study due to reduced LVEF and regional perfusion defect.   Echocardiogram 03/03/2018:  Left ventricle cavity is normal in size. Mild  hypertrophy of the left ventricle. Normal global wall motion. Frequent premature beats are seen throughout the test. Calculated EF 64%.  2. Left atrial cavity is mildly dilated. 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  3. Mild (Grade I) aortic regurgitation. 4. Mild (Grade I) mitral regurgitation. 5. Mild tricuspid regurgitation. No evidence of pulmonary hypertension.  EKG  05/17/2019: Normal sinus rhythm at rate 69 bpm, left atrial enlargement, normal axis, nonspecific T wave flattening. # PACs = 2.  No significant change from EKG 05/20/2018   Assessment     ICD-10-CM   1. Palpitations  R00.2   2. Primary hypertension  I10   3. Mild hyperlipidemia  E78.5      No orders of the defined types were placed in this encounter.   There are no discontinued  medications.  Recommendations:   Ernest Sanders  is a 74 y.o. Caucasian male with hypertension, asymptomatic sinus bradycardia, frequent PVCs and atrial bigeminy on the EKG, mild hyperlipidemia with abnormal nuclear stress test in December 2019, in the form of reduced LVEF although echocardiogram was normal, was started on ARB and low-dose statins for mild hyperlipidemia presents here for 6-week office visit and follow-up of hypertension andhyperlipidemia.  Due to severe degenerative joint disease, he is on Celebrex and has discontinued all other NSAIDs.  His blood pressure is well controlled, his lipids are also at goal.  We will continue to monitor his symptoms closely, patient presently enrolledin Remote Patient Monitoring and Principal Care Management as patient is high risk for hospitalization and complications from underlying medical conditions.   Adrian Prows, MD, [facility] ([place]) 07/05/2019, 1:17 PM [facility] Pager: [phone] Office: [phone]

## 2019-07-05 ENCOUNTER — Other Ambulatory Visit: Payer: Self-pay

## 2019-07-05 ENCOUNTER — Encounter: Payer: Self-pay | Admitting: Cardiology

## 2019-07-05 ENCOUNTER — Ambulatory Visit: Payer: Federal, State, Local not specified - PPO | Admitting: Cardiology

## 2019-07-05 VITALS — BP 132/78 | HR 60 | Temp 97.8°F | Resp 15 | Ht 70.0 in | Wt 165.0 lb

## 2019-07-05 DIAGNOSIS — E785 Hyperlipidemia, unspecified: Secondary | ICD-10-CM

## 2019-07-05 DIAGNOSIS — R002 Palpitations: Secondary | ICD-10-CM

## 2019-07-05 DIAGNOSIS — I10 Essential (primary) hypertension: Secondary | ICD-10-CM

## 2019-07-20 ENCOUNTER — Telehealth: Payer: Self-pay | Admitting: *Deleted

## 2019-07-20 MED ORDER — CELECOXIB 100 MG PO CAPS: 100.0000 mg | ORAL_CAPSULE | Freq: Two times a day (BID) | ORAL | 1 refills | Status: DC

## 2019-07-20 NOTE — Telephone Encounter (Signed)
Left message informing pt I had refilled the celebrex +1 additional refill, he would need an appt prior to future refills and if the pharmacist would escribe the request it would come directly to me and I would take care of refilling.

## 2019-07-20 NOTE — Telephone Encounter (Signed)
Pt states his pharmacist has made several efforts to get his celebrex filled, refill or leave a message as to why it can not be refilled.

## 2019-07-27 ENCOUNTER — Other Ambulatory Visit: Payer: Self-pay

## 2019-07-27 MED ORDER — CELECOXIB 100 MG PO CAPS: 100.0000 mg | ORAL_CAPSULE | Freq: Two times a day (BID) | ORAL | 1 refills | Status: DC

## 2019-09-27 ENCOUNTER — Other Ambulatory Visit: Payer: Self-pay | Admitting: Cardiology

## 2020-01-03 ENCOUNTER — Other Ambulatory Visit: Payer: Self-pay | Admitting: Cardiology

## 2020-02-10 ENCOUNTER — Telehealth: Payer: Self-pay | Admitting: Podiatry

## 2020-02-10 NOTE — Telephone Encounter (Signed)
Patient called in stating [employer] has sent over paper work and they need immediate response, (URGENT TURN AROUND) per patient. Patients also wants to know if he needs appointment to help speed up process, Please advise

## 2020-02-16 ENCOUNTER — Telehealth: Payer: Self-pay | Admitting: Podiatry

## 2020-02-16 NOTE — Telephone Encounter (Signed)
Patient called in stating [employer] has sent over paper work and they need immediate response, (URGENT TURN AROUND) per patient. Patients also wants to know if he needs appointment to help speed up process, Please advise    This was previous message from 02/10/20.Patient called in today stating in order for [employer] to cover meds, there needs to be a request for Urgent refill put in, they aren't trying to cover cost per patient

## 2020-02-17 NOTE — Telephone Encounter (Signed)
Good Morning, Would you like the patient to be scheduled before meds are refilled?

## 2020-02-17 NOTE — Telephone Encounter (Signed)
I haven't seen this patient since April. I don't know what the urgency is. The only prescription I've given him is Celebrex. I can send in a refill if he needs it. - Dr. Amalia Hailey

## 2020-02-17 NOTE — Telephone Encounter (Signed)
Per patient, he is my doctor that prescribed it for my toe, the urgent term is what [employer] told me it had to be not my words 02/17/20 8:52am

## 2020-02-17 NOTE — Telephone Encounter (Signed)
No, I'm okay to refill Celebrex if that's what he needs, just clarify what he is requesting and what is so urgent? - Dr. Amalia Hailey

## 2020-02-18 ENCOUNTER — Other Ambulatory Visit: Payer: Self-pay | Admitting: Podiatry

## 2020-02-18 MED ORDER — CELECOXIB 100 MG PO CAPS: 100.0000 mg | ORAL_CAPSULE | Freq: Two times a day (BID) | ORAL | 1 refills | Status: DC

## 2020-02-18 NOTE — Telephone Encounter (Signed)
Refill Celebrex 100mg  sent to pharmacy. - Dr. Amalia Hailey

## 2020-02-18 NOTE — Progress Notes (Signed)
Chronic foot pain.

## 2020-02-21 ENCOUNTER — Telehealth: Payer: Self-pay | Admitting: Podiatry

## 2020-02-21 NOTE — Telephone Encounter (Signed)
Patient has called in stating [employer] still hasn't received prior authorization for meds Marked URGENT per patient. Patient stated he called them and they gave specific directions on how to complete.   [phone], press option 1, second option press 3. Takes 5 mins to complete and once its complete he can pick up meds. Per patient    Please Advise

## 2020-03-28 ENCOUNTER — Other Ambulatory Visit: Payer: Self-pay | Admitting: Cardiology

## 2020-06-26 ENCOUNTER — Other Ambulatory Visit: Payer: Self-pay | Admitting: Cardiology

## 2020-07-05 ENCOUNTER — Encounter: Payer: Self-pay | Admitting: Cardiology

## 2020-07-05 ENCOUNTER — Ambulatory Visit: Payer: Federal, State, Local not specified - PPO | Admitting: Cardiology

## 2020-07-05 ENCOUNTER — Other Ambulatory Visit: Payer: Self-pay

## 2020-07-05 VITALS — BP 116/67 | HR 86 | Temp 98.0°F | Resp 17 | Ht 70.0 in | Wt 167.8 lb

## 2020-07-05 DIAGNOSIS — E785 Hyperlipidemia, unspecified: Secondary | ICD-10-CM

## 2020-07-05 DIAGNOSIS — I10 Essential (primary) hypertension: Secondary | ICD-10-CM

## 2020-07-05 DIAGNOSIS — R9439 Abnormal result of other cardiovascular function study: Secondary | ICD-10-CM

## 2020-07-05 NOTE — Progress Notes (Signed)
Primary Physician/Referring:  Ann Held, DO  Patient ID: DRUE HARR, male    DOB: [birth year], 75 y.o.   MRN: [id number]  Chief Complaint  Patient presents with  . Hypertension  . Follow-up    1 year   HPI:    Ernest Sanders  is a 75 y.o.  Caucasian male with hypertension, asymptomatic sinus bradycardia, frequent PVCs and atrial bigeminy on the EKG, mild hyperlipidemia with abnormal nuclear stress test in December 2019, in the form of reduced LVEF and a very small inferoapical defect suggestive of ischemia although echocardiogram was normal, was started on ARB and low-dose statins for hypertension mild hyperlipidemia.   Is currently asymptomatic and has been working on his house remodeling the house without any symptoms.  He does occasionally get mild chest tightness on the left side that last a few seconds during activity.  Otherwise presently no other specific symptoms except states that he has noticed mild swelling in his right thyroid area.  Past Medical History:  Diagnosis Date  . 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 Anxiety   . Arthritis   . Asthma    slight  . Atrial tachycardia ([place]) 05/20/2018  . Atrophy of muscle of both hands 08/05/2017  . DDD (degenerative disc disease), cervical 08/05/2017  . Depression   . Enlarged prostate   . GERD (gastroesophageal reflux disease)   . History of asthma 08/05/2017  . History of gastroesophageal reflux (GERD) 08/05/2017  . HTN (hypertension)   . Multiple allergies   . PAC (premature atrial contraction) 05/20/2018  . Primary osteoarthritis of both hands 08/05/2017  . Recurrent infections 08/05/2017  . RMSF [facility] spotted fever) 11/08/2016  . Shortness of breath dyspnea    Past Surgical History:  Procedure Laterality Date  . INGUINAL HERNIA REPAIR Bilateral 11/21/2015   Procedure: OPEN BILATERAL INGUINAL HERNIA REPAIR;  Surgeon: [name], MD;  Location: [place];  Service: General;  Laterality: Bilateral;   . INSERTION OF MESH Bilateral 11/21/2015   Procedure: INSERTION OF MESH;  Surgeon: [name], MD;  Location: [place];  Service: General;  Laterality: Bilateral;  . LIPOMA EXCISION N/A 11/21/2015   Procedure: EXCISION TWO ABDOMINAL WALL LIPOMAS;  Surgeon: [name], MD;  Location: [place];  Service: General;  Laterality: N/A;  . TOOTH ECTRACTION Right   . UMBILICAL HERNIA REPAIR  2002   Family History  Problem Relation Age of Onset  . Heart disease Father        CHF  . Hypertension Mother   . Diabetes Mother        Surgical  . Arthritis Other   . Stroke Paternal Grandmother     Social History   Tobacco Use  . Smoking status: Former Smoker    Types: Pipe, Cigars    Quit date: 1984    Years since quitting: 38.3  . Smokeless tobacco: Never Used  Substance Use Topics  . Alcohol use: No    Comment: Recovering, November 1972   ROS  Review of Systems  Cardiovascular: Negative for chest pain, dyspnea on exertion and leg swelling.  Endocrine:       Enlarged thyroid on the right  Musculoskeletal: Positive for arthritis (diffuse osteo arthritis).  Gastrointestinal: Negative for melena.   Objective  Blood pressure 116/67, pulse 86, temperature 98 F (36.7 C), temperature source Temporal, resp. rate 17, height 5\' 10"  (1.778 m), weight 167 lb 12.8 oz (76.1 kg), SpO2 95 %.  Vitals with BMI 07/05/2020 07/05/2019 05/17/2019  Height 5\' 10"  5\' 10"  5' 9.5"  Weight 167 lbs 13 oz 165 lbs 168 lbs  BMI 24.08 14.78 29.56  Systolic 213 086 578  Diastolic 67 78 76  Pulse 86 60 72     Physical Exam Cardiovascular:     Rate and Rhythm: Normal rate and regular rhythm. Occasional extrasystoles are present.    Pulses: Intact distal pulses.     Heart sounds: Normal heart sounds. No murmur heard. No gallop.      Comments: No leg edema, no JVD. Pulmonary:     Effort: Pulmonary effort is normal.     Breath sounds: Normal breath sounds.  Abdominal:     General: Bowel sounds are normal.      Palpations: Abdomen is soft.    Laboratory examination:   No results for input(s): NA, K, CL, CO2, GLUCOSE, BUN, CREATININE, CALCIUM, GFRNONAA, GFRAA in the last 8760 hours. CrCl cannot be calculated (Patient's most recent lab result is older than the maximum 21 days allowed.).  CMP Latest Ref Rng & Units 06/21/2019 05/21/2017 10/10/2016  Glucose 65 - 99 mg/dL 96 119(H) 98  BUN 8 - 27 mg/dL 19 20 16   Creatinine 0.76 - 1.27 mg/dL 1.04 1.10 1.10  Sodium 134 - 144 mmol/L 141 143 141  Potassium 3.5 - 5.2 mmol/L 4.3 4.7 4.9  Chloride 96 - 106 mmol/L 105 107 106  CO2 20 - 29 mmol/L 23 28 31   Calcium 8.6 - 10.2 mg/dL 9.4 9.9 10.2  Total Protein 6.0 - 8.5 g/dL 6.4 7.1 7.6  Total Bilirubin 0.0 - 1.2 mg/dL 0.4 0.2 0.5  Alkaline Phos 39 - 117 IU/L 71 48 53  AST 0 - 40 IU/L 30 26 26   ALT 0 - 44 IU/L 18 14 16    CBC Latest Ref Rng & Units 06/21/2019 05/21/2017 10/10/2016  WBC 3.4 - 10.8 x10E3/uL 5.2 6.8 7.0  Hemoglobin 13.0 - 17.7 g/dL 14.7 15.3 15.8  Hematocrit 37.5 - 51.0 % 41.5 44.2 47.6  Platelets 150 - 450 x10E3/uL 203 219.0 233.0   Lipid Panel     Component Value Date/Time   CHOL 172 06/21/2019 0932   TRIG 82 06/21/2019 0932   HDL 62 06/21/2019 0932   LDLCALC 95 06/21/2019 0932   HEMOGLOBIN A1C No results found for: HGBA1C, MPG TSH No results for input(s): TSH in the last 8760 hours. Medications and allergies   Allergies  Allergen Reactions  . 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 Lactose Intolerance (Gi) Diarrhea    Bloating,gas    Outpatient Medications Prior to Visit  Medication Sig Dispense Refill  . aspirin 81 MG tablet Take 81 mg by mouth daily.    . cetirizine (ZYRTEC) 10 MG tablet Take 10 mg by mouth daily.    . diphenhydrAMINE (BENADRYL) 25 MG tablet Take 25 mg by mouth every 6 (six) hours as needed.    Marland Kitchen EPINEPHrine 0.3 mg/0.3 mL IJ SOAJ injection Inject 0.3 mLs into the muscle as needed (allergic reaction).   1  . fluticasone (FLONASE) 50 MCG/ACT nasal spray  SHAKE LQ AND U 1 TO 2 SPRAYS IEN QD  6  . ibuprofen (ADVIL) 200 MG tablet Take 200 mg by mouth every 6 (six) hours as needed. Taking PRN for leg pain    . Multiple Vitamin (MULTIVITAMIN) tablet Take 1 tablet by mouth daily.    . pravastatin (PRAVACHOL) 20 MG tablet TAKE 1 TABLET BY MOUTH EVERY  EVENING AFTER DINNER 90 tablet 3  . valsartan-hydrochlorothiazide (DIOVAN-HCT) 160-12.5 MG tablet TAKE 1 TABLET BY MOUTH EVERY MORNING 90 tablet 0  . verapamil (VERELAN PM) 120 MG 24 hr capsule TAKE 1 CAPSULE BY MOUTH EVERY MORNING 90 capsule 0  . vitamin C (ASCORBIC ACID) 250 MG tablet Take 500 mg by mouth 2 (two) times daily. 1 in the morning and 1 in the evening    . celecoxib (CELEBREX) 100 MG capsule Take 1 capsule (100 mg total) by mouth 2 (two) times daily. 60 capsule 1   No facility-administered medications prior to visit.    Radiology:   No results found.  Cardiac Studies:   Exercise sestamibi stress test 02/23/2018:  1. The patient performed treadmill exercise using Bruce protocol, completing 6:30 minutes. The patient completed an estimated workload of 6.5 METS, reaching 109% of the maximum predicted heart rate. Resting hypertension 164/90 mmHg, with exaggerated exercise response seen with peak BP reaching 210/72 mmHg. Exercise capacity was low normal. No stress symptoms reported.  2. The overall quality of the study is good. Left ventricular cavity is noted to be enlarged on the rest and stress studies. Gated SPECT images reveal global decrease in myocardial thickening and wall motion. The left ventricular ejection fraction was calculated or visually estimated to be 38%. Small sized, mild intensity, reversible perfusion defect in apical inferolateral myocardium.  3. High risk study due to reduced LVEF and regional perfusion defect.   Echocardiogram 03/03/2018:  Left ventricle cavity is normal in size. Mild  hypertrophy of the left ventricle. Normal global wall motion. Frequent premature beats  are seen throughout the test. Calculated EF 64%.  2. Left atrial cavity is mildly dilated. 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  3. Mild (Grade I) aortic regurgitation. 4. Mild (Grade I) mitral regurgitation. 5. Mild tricuspid regurgitation. No evidence of pulmonary hypertension.  EKG EKG 07/05/2020: Normal sinus rhythm at rate of 79 bpm, normal axis.  No evidence of ischemia, single PVC.  Low-voltage complexes. No significant change from 05/17/2019.     Assessment     ICD-10-CM   1. Primary hypertension  I10 EKG 12-Lead  2. Mild hyperlipidemia  E78.5   3. Abnormal nuclear stress test  R94.39      No orders of the defined types were placed in this encounter.   Medications Discontinued During This Encounter  Medication Reason  . celecoxib (CELEBREX) 100 MG capsule Error    Recommendations:   Ernest Sanders  is a 75 y.o. Caucasian male with hypertension, asymptomatic sinus bradycardia, frequent PVCs and atrial bigeminy on the EKG, mild hyperlipidemia with abnormal nuclear stress test in December 2019, in the form of reduced LVEF and a very small inferoapical defect suggestive of ischemia although echocardiogram was normal, was started on ARB and low-dose statins for hypertension mild hyperlipidemia.   Is currently asymptomatic and has been working on his house remodeling the house without any symptoms.  He does occasionally get mild chest tightness on the left side that last a few seconds during activity but does not appear to be consistent with angina pectoris.  His main concern is thyromegaly on the right, I do feel that he may have mild enlargement of the thyroid gland on the right, I encouraged him to make an appointment to see Dr. Etter Sjogren as he needs annual labs as well.  With regard to hyperlipidemia, he is tolerating low-dose statins, he is very meticulous about what medicines he  takes and states that he does his own research.  If LDL  is not closer to 70, could consider increasing pravastatin to 40 mg.  With valsartan and verapamil combination, symptoms of palpitations are essentially resolved and blood pressure is also well controlled.  He is presently doing RPM in our office for hypertension and he would like to continue the same for now.  I will see him back in a year or sooner if problems.  Advised him that if he has no further appointment to see his PCP soon, to inform me and I will be happy to order labs.   Adrian Prows, MD, [facility] 07/05/2020, 2:06 PM Office: [phone] Pager: [phone]

## 2020-07-11 ENCOUNTER — Other Ambulatory Visit: Payer: Self-pay

## 2020-07-11 ENCOUNTER — Encounter: Payer: Self-pay | Admitting: Family

## 2020-07-11 ENCOUNTER — Ambulatory Visit: Payer: Federal, State, Local not specified - PPO | Admitting: Family

## 2020-07-11 ENCOUNTER — Encounter: Payer: Federal, State, Local not specified - PPO | Admitting: Family Medicine

## 2020-07-11 VITALS — BP 120/78 | HR 73 | Temp 98.7°F | Resp 16 | Ht 70.0 in | Wt 161.0 lb

## 2020-07-11 DIAGNOSIS — R591 Generalized enlarged lymph nodes: Secondary | ICD-10-CM | POA: Diagnosis not present

## 2020-07-11 DIAGNOSIS — R61 Generalized hyperhidrosis: Secondary | ICD-10-CM

## 2020-07-11 NOTE — Patient Instructions (Signed)
Please complete lab work prior to leaving. Call if new/worsening symptoms.  You should be contacted about scheduling your appointment for the neck ultrasound.

## 2020-07-11 NOTE — Progress Notes (Signed)
Subjective:   By signing my name below, I, Shehryar Baig, attest that this documentation has been prepared under the direction and in the presence of Debbrah Alar NP. 07/11/2020      Patient ID: Ernest Sanders, male    DOB: 1945-10-26, 75 y.o.   MRN: [id number]  No chief complaint on file.   HPI Patient is in today for a office visit. He is complaining of swelling on the right side of his neck. His cardiologist recommended he get it evaluated. He is also complaining of chest congestion. He was doing some house work and noted that he breathed in some saw dust. He has had some seasonal allergies.   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 It has decreased in frequency in the last 6 months. He primarily uses his land line for communication. He has a mobile phone but does not take it with him everywhere.    Past Medical History:  Diagnosis Date  . 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 Anxiety   . Arthritis   . Asthma    slight  . Atrial tachycardia ([place]) 05/20/2018  . Atrophy of muscle of both hands 08/05/2017  . DDD (degenerative disc disease), cervical 08/05/2017  . Depression   . Enlarged prostate   . GERD (gastroesophageal reflux disease)   . History of asthma 08/05/2017  . History of gastroesophageal reflux (GERD) 08/05/2017  . HTN (hypertension)   . Multiple allergies   . PAC (premature atrial contraction) 05/20/2018  . Primary osteoarthritis of both hands 08/05/2017  . Recurrent infections 08/05/2017  . RMSF [facility] spotted fever) 11/08/2016  . Shortness of breath dyspnea     Past Surgical History:  Procedure Laterality Date  . INGUINAL HERNIA REPAIR Bilateral 11/21/2015   Procedure: OPEN BILATERAL INGUINAL HERNIA REPAIR;  Surgeon: [name], MD;  Location: [place];  Service: General;  Laterality: Bilateral;  . INSERTION OF MESH Bilateral 11/21/2015   Procedure: INSERTION OF MESH;   Surgeon: [name], MD;  Location: [place];  Service: General;  Laterality: Bilateral;  . LIPOMA EXCISION N/A 11/21/2015   Procedure: EXCISION TWO ABDOMINAL WALL LIPOMAS;  Surgeon: [name], MD;  Location: [place];  Service: General;  Laterality: N/A;  . TOOTH ECTRACTION Right   . UMBILICAL HERNIA REPAIR  2002    Family History  Problem Relation Age of Onset  . Heart disease Father        CHF  . Hypertension Mother   . Diabetes Mother        Surgical  . Arthritis Other   . Stroke Paternal Grandmother     Social History   Socioeconomic History  . Marital status: Single    Spouse name: Not on file  . Number of children: 0  . Years of education: 72  . Highest education level: Not on file  Occupational History  . Occupation: retired Scientist, research (medical)  Tobacco Use  . Smoking status: Former Smoker    Types: Pipe, Cigars    Quit date: 1984    Years since quitting: 38.3  . Smokeless tobacco: Never Used  Vaping Use  . Vaping Use: Never used  Substance and Sexual Activity  . Alcohol use: No    Comment: Recovering, November 1972  . Drug use: No  . Sexual activity: Not Currently  Other Topics Concern  . Not on file  Social History Narrative   Lives alone in a one story home.  No children.  Retired Scientist, research (medical).  Education: some college.    Social Determinants of Health   Financial Resource Strain: Not on file  Food Insecurity: Not on file  Transportation Needs: Not on file  Physical Activity: Not on file  Stress: Not on file  Social Connections: Not on file  Intimate Partner Violence: Not on file    Outpatient Medications Prior to Visit  Medication Sig Dispense Refill  . aspirin 81 MG tablet Take 81 mg by mouth daily.    . cetirizine (ZYRTEC) 10 MG tablet Take 10 mg by mouth daily.    . diphenhydrAMINE (BENADRYL) 25 MG tablet Take 25 mg by mouth every 6 (six) hours as needed.    Marland Kitchen EPINEPHrine 0.3 mg/0.3 mL IJ SOAJ injection Inject 0.3 mLs into the muscle as needed  (allergic reaction).   1  . fluticasone (FLONASE) 50 MCG/ACT nasal spray SHAKE LQ AND U 1 TO 2 SPRAYS IEN QD  6  . ibuprofen (ADVIL) 200 MG tablet Take 200 mg by mouth every 6 (six) hours as needed. Taking PRN for leg pain    . Multiple Vitamin (MULTIVITAMIN) tablet Take 1 tablet by mouth daily.    . pravastatin (PRAVACHOL) 20 MG tablet TAKE 1 TABLET BY MOUTH EVERY EVENING AFTER DINNER 90 tablet 3  . valsartan-hydrochlorothiazide (DIOVAN-HCT) 160-12.5 MG tablet TAKE 1 TABLET BY MOUTH EVERY MORNING 90 tablet 0  . verapamil (VERELAN PM) 120 MG 24 hr capsule TAKE 1 CAPSULE BY MOUTH EVERY MORNING 90 capsule 0  . vitamin C (ASCORBIC ACID) 250 MG tablet Take 500 mg by mouth 2 (two) times daily. 1 in the morning and 1 in the evening     No facility-administered medications prior to visit.    Allergies  Allergen Reactions  . 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 Lactose Intolerance (Gi) Diarrhea    Bloating,gas    Review of Systems  Respiratory:       Chest congestion       Objective:    Physical Exam Constitutional:      Appearance: Normal appearance.  HENT:     Head: Normocephalic and atraumatic.     Right Ear: External ear normal.     Left Ear: External ear normal.  Eyes:     Extraocular Movements: Extraocular movements intact.     Pupils: Pupils are equal, round, and reactive to light.  Neck:     Thyroid: No thyroid mass or thyromegaly.  Cardiovascular:     Rate and Rhythm: Normal rate and regular rhythm.     Pulses: Normal pulses.     Heart sounds: Normal heart sounds. No murmur heard. No friction rub. No gallop.   Pulmonary:     Effort: Pulmonary effort is normal. No respiratory distress.     Breath sounds: Normal breath sounds. No stridor. No wheezing, rhonchi or rales.  Lymphadenopathy:     Comments: Mildly enlarged right anterior cervical lymph node.  Skin:    General: Skin is warm and dry.  Neurological:     Mental Status: He is alert and oriented to  person, place, and time.  Psychiatric:        Behavior: Behavior normal.     There were no vitals taken for this visit. Wt Readings from Last 3 Encounters:  07/05/20 167 lb 12.8 oz (76.1 kg)  07/05/19 165 lb (74.8 kg)  05/17/19 168 lb (  76.2 kg)    Diabetic Foot Exam - Simple   No data filed    Lab Results  Component Value Date   WBC 5.2 06/21/2019   HGB 14.7 06/21/2019   HCT 41.5 06/21/2019   PLT 203 06/21/2019   GLUCOSE 96 06/21/2019   CHOL 172 06/21/2019   TRIG 82 06/21/2019   HDL 62 06/21/2019   LDLCALC 95 06/21/2019   ALT 18 06/21/2019   AST 30 06/21/2019   NA 141 06/21/2019   K 4.3 06/21/2019   CL 105 06/21/2019   CREATININE 1.04 06/21/2019   BUN 19 06/21/2019   CO2 23 06/21/2019   TSH 3.820 06/21/2019    Lab Results  Component Value Date   TSH 3.820 06/21/2019   Lab Results  Component Value Date   WBC 5.2 06/21/2019   HGB 14.7 06/21/2019   HCT 41.5 06/21/2019   MCV 92 06/21/2019   PLT 203 06/21/2019   Lab Results  Component Value Date   NA 141 06/21/2019   K 4.3 06/21/2019   CO2 23 06/21/2019   GLUCOSE 96 06/21/2019   BUN 19 06/21/2019   CREATININE 1.04 06/21/2019   BILITOT 0.4 06/21/2019   ALKPHOS 71 06/21/2019   AST 30 06/21/2019   ALT 18 06/21/2019   PROT 6.4 06/21/2019   ALBUMIN 4.2 06/21/2019   CALCIUM 9.4 06/21/2019   ANIONGAP 6 11/14/2015   GFR 70.23 05/21/2017   Lab Results  Component Value Date   CHOL 172 06/21/2019   Lab Results  Component Value Date   HDL 62 06/21/2019   Lab Results  Component Value Date   LDLCALC 95 06/21/2019   Lab Results  Component Value Date   TRIG 82 06/21/2019   No results found for: CHOLHDL No results found for: HGBA1C     Assessment & Plan:   Problem List Items Addressed This Visit   None      No orders of the defined types were placed in this encounter.   I, Debbrah Alar NP, personally preformed the services described in this documentation.  All medical record entries  made by the scribe were at my direction and in my presence.  I have reviewed the chart and discharge instructions (if applicable) and agree that the record reflects my personal performance and is accurate and complete. 07/11/2020   I,Shehryar Baig,acting as a Education administrator for Nance Pear, NP.,have documented all relevant documentation on the behalf of Nance Pear, NP,as directed by  Nance Pear, NP while in the presence of Nance Pear, NP.   Shehryar [employer]

## 2020-07-11 NOTE — Assessment & Plan Note (Signed)
Will obtain neck US to further evaluate.

## 2020-07-11 NOTE — Assessment & Plan Note (Signed)
Improving. Will check TB gold, CBC and TSH.

## 2020-07-12 LAB — CBC WITH DIFFERENTIAL/PLATELET
Basophils Absolute: 0.1 10*3/uL (ref 0.0–0.1)
Basophils Relative: 1.1 % (ref 0.0–3.0)
Eosinophils Absolute: 0.1 10*3/uL (ref 0.0–0.7)
Eosinophils Relative: 1.3 % (ref 0.0–5.0)
HCT: 40.2 % (ref 39.0–52.0)
Hemoglobin: 14 g/dL (ref 13.0–17.0)
Lymphocytes Relative: 25.3 % (ref 12.0–46.0)
Lymphs Abs: 1.4 10*3/uL (ref 0.7–4.0)
MCHC: 34.8 g/dL (ref 30.0–36.0)
MCV: 93.3 fl (ref 78.0–100.0)
Monocytes Absolute: 0.5 10*3/uL (ref 0.1–1.0)
Monocytes Relative: 8.5 % (ref 3.0–12.0)
Neutro Abs: 3.5 10*3/uL (ref 1.4–7.7)
Neutrophils Relative %: 63.8 % (ref 43.0–77.0)
Platelets: 216 10*3/uL (ref 150.0–400.0)
RBC: 4.31 Mil/uL (ref 4.22–5.81)
RDW: 13.3 % (ref 11.5–15.5)
WBC: 5.5 10*3/uL (ref 4.0–10.5)

## 2020-07-12 LAB — TSH: TSH: 1.5 u[IU]/mL (ref 0.35–4.50)

## 2020-07-13 ENCOUNTER — Ambulatory Visit (HOSPITAL_BASED_OUTPATIENT_CLINIC_OR_DEPARTMENT_OTHER)
Admission: RE | Admit: 2020-07-13 | Discharge: 2020-07-13 | Disposition: A | Discharge status: Home / Self Care | Payer: Federal, State, Local not specified - PPO | Source: Ambulatory Visit | Attending: Family | Admitting: Family

## 2020-07-13 ENCOUNTER — Other Ambulatory Visit: Payer: Self-pay

## 2020-07-13 DIAGNOSIS — R591 Generalized enlarged lymph nodes: Secondary | ICD-10-CM | POA: Insufficient documentation

## 2020-07-13 LAB — QUANTIFERON-TB GOLD PLUS
Mitogen-NIL: >10 IU/mL
NIL: 0.02 IU/mL
QuantiFERON-TB Gold Plus: NEGATIVE
TB1-NIL: 0 IU/mL
TB2-NIL: 0 IU/mL

## 2020-09-24 ENCOUNTER — Other Ambulatory Visit: Payer: Self-pay | Admitting: Cardiology

## 2021-01-01 ENCOUNTER — Encounter: Payer: Self-pay | Admitting: Pharmacist

## 2021-01-01 NOTE — Progress Notes (Signed)
CARE PLAN ENTRY  01/01/2021 Name: Ernest Sanders MRN: [id number] DOB: [birth year]  Ernest Sanders is enrolled in Remote Patient Monitoring/Principle Care Monitoring.  Date of Enrollment: 05/17/19 Supervising physician: Adrian Prows Indication: HTN  Remote Readings: Compliant and Avg BP: 124/74, HR:61  Next scheduled OV: 07/05/20  Pharmacist Clinical Goal(s):  Over the next 90 days, patient will demonstrate Improved medication adherence as evidenced by medication fill history Over the next 90 days, patient will demonstrate improved understanding of prescribed medications and rationale for usage as evidenced by patient teach back Over the next 90 days, patient will experience decrease in ED visits. ED visits in last 6 months = 0 Over the next 90 days, patient will not experience hospital admission. Hospital Admissions in last 6 months = 0  Interventions: Provider and Inter-disciplinary care team collaboration (see longitudinal plan of care) Comprehensive medication review performed. 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   cetirizine (ZYRTEC) 10 MG tablet Take 10 mg by mouth daily.   diphenhydrAMINE (BENADRYL) 25 MG tablet Take 25 mg by mouth every 6 (six) hours as needed.   EPINEPHrine 0.3 mg/0.3 mL IJ SOAJ injection Inject 0.3 mLs into the muscle as needed (allergic reaction).    fluticasone  (FLONASE) 50 MCG/ACT nasal spray SHAKE LQ AND U 1 TO 2 SPRAYS IEN QD   ibuprofen (ADVIL) 200 MG tablet Take 200 mg by mouth every 6 (six) hours as needed. Taking PRN for leg pain   Multiple Vitamin (MULTIVITAMIN) tablet Take 1 tablet by mouth daily.   pravastatin (PRAVACHOL) 20 MG tablet TAKE 1 TABLET BY MOUTH EVERY EVENING AFTER DINNER   valsartan-hydrochlorothiazide (DIOVAN-HCT) 160-12.5 MG tablet TAKE 1 TABLET BY MOUTH EVERY MORNING   verapamil (VERELAN PM) 120 MG 24 hr capsule TAKE 1 CAPSULE BY MOUTH EVERY MORNING   vitamin C (ASCORBIC ACID) 250 MG tablet Take 500 mg by mouth 2 (two) times daily. 1 in the morning and 1 in the evening   No facility-administered encounter medications on file as of 01/01/2021.    Hypertension   BP goal is:  <130/80  Office blood pressures are  BP Readings from Last 3 Encounters:  07/11/20 120/78  07/05/20 116/67  07/05/19 132/78    Patient is currently controlled on the following medications: Valsartan-HCTZ 160/12.5, verapamil 120 mg,   Patient checks BP at home daily  Patient home BP readings are ranging: 90-138/53-91  Patient has tried  these meds in the past: N/A  We discussed diet and exercise extensively  Plan  Continue current medications and control with diet and exercise   BP continues to remain stable and controlled on current therapy. Will continue to monitor and follow up as needed    ______________ Visit Information SDOH (Social Determinants of Health) assessments performed: Yes.  [name] was given information about Principle Care Management/Remote Patient Monitoring services today including:  RPM/PCM service includes personalized support from designated clinical staff supervised by his physician, including individualized plan  of care and coordination with other care providers 24/7 contact phone numbers for assistance for urgent and routine care needs. Standard insurance, coinsurance, copays and deductibles apply for  principle care management only during months in which we provide at least 30 minutes of these services. Most insurances cover these services at 100%, however patients may be responsible for any copay, coinsurance and/or deductible if applicable. This service may help you avoid the need for more expensive face-to-face services. Only one practitioner may furnish and bill the service in a calendar month. The patient may stop PCM/RPM services at any time (effective at the end of the month) by phone call to the office staff.  Patient agreed to services and verbal consent obtained.   Manuela Schwartz, Pharm.D. [facility] [phone] Ext: [phone]

## 2021-01-01 NOTE — Progress Notes (Signed)
This encounter was created in error - please disregard.

## 2021-06-22 HISTORY — PX: EYE SURGERY: SHX253

## 2021-07-02 ENCOUNTER — Ambulatory Visit (INDEPENDENT_AMBULATORY_CARE_PROVIDER_SITE_OTHER): Payer: Federal, State, Local not specified - PPO

## 2021-07-02 ENCOUNTER — Encounter: Payer: Self-pay | Admitting: Podiatry

## 2021-07-02 ENCOUNTER — Ambulatory Visit: Payer: Federal, State, Local not specified - PPO | Admitting: Podiatry

## 2021-07-02 DIAGNOSIS — M2041 Other hammer toe(s) (acquired), right foot: Secondary | ICD-10-CM | POA: Diagnosis not present

## 2021-07-02 DIAGNOSIS — M19071 Primary osteoarthritis, right ankle and foot: Secondary | ICD-10-CM | POA: Diagnosis not present

## 2021-07-02 NOTE — Progress Notes (Signed)
? ?  HPI: 76 y.o. male presenting today for evaluation of right foot pain.  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  He says that he is ready for surgery.  It is beginning to affect his daily activities.  He has tried multiple conservative modalities including wide fitting shoes and arch supports.  He is also seen other podiatrist in the past. ? ?Past Medical History:  ?Diagnosis Date  ? Alcohol abuse   ? Sober since 01/08/71  ? Anxiety   ? Arthritis   ? Asthma   ? slight  ? Atrial tachycardia ([place]) 05/20/2018  ? Atrophy of muscle of both hands 08/05/2017  ? DDD (degenerative disc disease), cervical 08/05/2017  ? Depression   ? Enlarged prostate   ? GERD (gastroesophageal reflux disease)   ? History of asthma 08/05/2017  ? History of gastroesophageal reflux (GERD) 08/05/2017  ? HTN (hypertension)   ? Multiple allergies   ? PAC (premature atrial contraction) 05/20/2018  ? Primary osteoarthritis of both hands 08/05/2017  ? Recurrent infections 08/05/2017  ? [facility] spotted fever) 11/08/2016  ? Shortness of breath dyspnea   ? ?  ?Physical Exam: ?General: The patient is alert and oriented x3 in no acute distress. ? ?Dermatology: Hyperkeratotic tissue noted to the 4th interdigital webspace of the right foot. Skin is warm, dry and supple bilateral lower extremities. Negative for open lesions or macerations. ? ?Vascular: Palpable pedal pulses bilaterally. No edema or erythema noted. Capillary refill within normal limits. ? ?Neurological: Epicritic and protective threshold intact.   ? ?Musculoskeletal Exam: Pain on palpation and range of motion to the second MTP joint as well as to the PIP of the right second digit.  Enlargement of the PIPJ also noted clinically ? ?Radiographic exam RT foot 07/02/2021: Degenerative changes noted to the second MTP joint of the right foot.  There is also degenerative changes with enlargement of the second PIPJ right foot.  No acute fractures identified.  No cortical  irregularities. ? ?Assessment: ?1. 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  DJD PIPJ second digit right ? ? ? ?Plan of Care:  ?1. Patient evaluated. ?2. Today we discussed the conservative versus surgical management of the presenting pathology. The patient opts for surgical management. All possible complications and details of the procedure were explained. All patient questions were answered. No guarantees were expressed or implied. ?3. Authorization for surgery was initiated today. Surgery will consist of second metatarsal head resection right.  PIPJ arthroplasty second right. ?4.  Continue Celebrex as prescribed as needed ?5.  Cardiac clearance requested prior to surgery  ?6.  Return to clinic 1 week postop ? ?  ?Edrick Kins, DPM ?[place] ? ?Dr. Edrick Kins, DPM  ?  ?[address]                                        ?[place], [place] [address]                ?Office [phone]  ?Fax [phone] ? ? ? ? ?

## 2021-07-04 ENCOUNTER — Encounter: Payer: Self-pay | Admitting: Cardiology

## 2021-07-04 ENCOUNTER — Telehealth: Payer: Self-pay | Admitting: Urology

## 2021-07-04 NOTE — Telephone Encounter (Signed)
DOS - 07/19/21 ? ?METATARSAL HEAD RESECTION 2ND RIGHT --- 28112 ?HAMMERTOE REPAIR 2ND RIGHT --- 28285 ? ? ?[employer] EFFECTIVE DATE - 03/04/05 ? ?PLAN DEDUCTIBLE - $350.00 W/ $0.00 REMAINING ?OUT OF POCKET - $6,000.00 W/ $4,494.00 REMAINING ?COINSURANCE - 0% ?COPAY - $0.00 ? ? ?NO PRIOR AUTH REQUIRED ?

## 2021-07-05 ENCOUNTER — Ambulatory Visit: Payer: Federal, State, Local not specified - PPO | Admitting: Cardiology

## 2021-07-05 ENCOUNTER — Encounter: Payer: Self-pay | Admitting: Cardiology

## 2021-07-05 VITALS — BP 131/67 | HR 67 | Temp 97.5°F | Resp 16 | Ht 70.0 in | Wt 158.0 lb

## 2021-07-05 DIAGNOSIS — I83893 Varicose veins of bilateral lower extremities with other complications: Secondary | ICD-10-CM

## 2021-07-05 DIAGNOSIS — E785 Hyperlipidemia, unspecified: Secondary | ICD-10-CM

## 2021-07-05 DIAGNOSIS — I10 Essential (primary) hypertension: Secondary | ICD-10-CM

## 2021-07-05 NOTE — Progress Notes (Signed)
? ?Primary Physician/Referring:  Ann Held, DO ? ?Patient ID: Ernest Sanders, male    DOB: 02-03-1946, 76 y.o.   MRN: [id number] ? ?Chief Complaint  ?Patient presents with  ? Hypertension  ? Hyperlipidemia  ? Follow-up  ?  1 year ?  ? ?HPI:   ? ?Ernest Sanders  is a 76 y.o.  Caucasian male with hypertension, asymptomatic sinus bradycardia, frequent PVCs and atrial bigeminy on the EKG, mild hyperlipidemia with abnormal nuclear stress test in December 2019, in the form of reduced LVEF and a very small inferoapical defect suggestive of ischemia although echocardiogram was normal, was started on ARB and low-dose statins for hypertension mild hyperlipidemia.  ? ?Is currently asymptomatic and has been working on his house remodeling the house without any symptoms.  He has noticed mild swelling in his legs and prominent veins on his legs. He is wondering if this is psoriatic changes. ? ?Past Medical History:  ?Diagnosis Date  ? Alcohol abuse   ? Sober since 01/08/71  ? Anxiety   ? Arthritis   ? Asthma   ? slight  ? Atrial tachycardia ([place]) 05/20/2018  ? Atrophy of muscle of both hands 08/05/2017  ? DDD (degenerative disc disease), cervical 08/05/2017  ? Depression   ? Enlarged prostate   ? GERD (gastroesophageal reflux disease)   ? History of asthma 08/05/2017  ? History of gastroesophageal reflux (GERD) 08/05/2017  ? HTN (hypertension)   ? Hyperlipidemia   ? Multiple allergies   ? PAC (premature atrial contraction) 05/20/2018  ? Primary osteoarthritis of both hands 08/05/2017  ? Recurrent infections 08/05/2017  ? [facility] spotted fever) 11/08/2016  ? Shortness of breath dyspnea   ? ?Past Surgical History:  ?Procedure Laterality Date  ? EYE SURGERY  06/22/2021  ? INGUINAL HERNIA REPAIR Bilateral 11/21/2015  ? Procedure: OPEN BILATERAL INGUINAL HERNIA REPAIR;  Surgeon: [name], MD;  Location: [place];  Service: General;  Laterality: Bilateral;  ? INSERTION OF MESH Bilateral 11/21/2015  ?  Procedure: INSERTION OF MESH;  Surgeon: [name], MD;  Location: [place];  Service: General;  Laterality: Bilateral;  ? LIPOMA EXCISION N/A 11/21/2015  ? Procedure: EXCISION TWO ABDOMINAL WALL LIPOMAS;  Surgeon: [name], MD;  Location: [place];  Service: General;  Laterality: N/A;  ? TOOTH ECTRACTION Right   ? UMBILICAL HERNIA REPAIR  2002  ? ?Family History  ?Problem Relation Age of Onset  ? Heart disease Father   ?     CHF  ? Hypertension Mother   ? Diabetes Mother   ?     Surgical  ? Arthritis Other   ? Stroke Paternal Grandmother   ?  ?Social History  ? ?Tobacco Use  ? Smoking status: Former  ?  Types: Pipe, Cigars  ?  Quit date: 26  ?  Years since quitting: 39.3  ? Smokeless tobacco: Never  ?Substance Use Topics  ? Alcohol use: No  ?  Comment: Recovering, November 1972  ? ?ROS  ?Review of Systems  ?Cardiovascular:  Negative for chest pain, dyspnea on exertion and leg swelling.  ?Endocrine:  ?     Enlarged thyroid on the right  ?Musculoskeletal:  Positive for arthritis (diffuse osteo arthritis).  ?Gastrointestinal:  Negative for melena.  ?Objective  ?Blood pressure 131/67, pulse 67, temperature (!) 97.5 ?F (36.4 ?C), temperature source Temporal, resp. rate 16, height '5\' 10"'$  (1.778 m), weight 158 lb (71.7 kg).  ? ?  07/05/2021  ?  1:24 PM 07/11/2020  ?  3:43 PM 07/05/2020  ? 12:55 PM  ?Vitals with BMI  ?Height '5\' 10"'$  '5\' 10"'$  '5\' 10"'$   ?Weight 158 lbs 161 lbs 167 lbs 13 oz  ?BMI 22.67 23.1 24.08  ?Systolic 376 283 151  ?Diastolic 67 78 67  ?Pulse 67 73 86  ?  ? Physical Exam ?Neck:  ?   Vascular: No JVD.  ?Cardiovascular:  ?   Rate and Rhythm: Normal rate and regular rhythm. Occasional Extrasystoles are present. ?   Pulses: Intact distal pulses.  ?   Heart sounds: Normal heart sounds. No murmur heard. ?  No gallop.  ?Pulmonary:  ?   Effort: Pulmonary effort is normal.  ?   Breath sounds: Normal breath sounds.  ?Abdominal:  ?   General: Bowel sounds are normal.  ?   Palpations: Abdomen is soft.  ?Musculoskeletal:   ?   Right lower leg: Edema (2+ below knee and superficial varicose veins noted) present.  ?   Left lower leg: Edema (1-2+ below knee pitting edema) present.  ? ?Laboratory examination:  ? ?No results for input(s): NA, K, CL, CO2, GLUCOSE, BUN, CREATININE, CALCIUM, GFRNONAA, GFRAA in the last 8760 hours. ?CrCl cannot be calculated (Patient's most recent lab result is older than the maximum 21 days allowed.).  ? ?  Latest Ref Rng & Units 06/21/2019  ?  9:32 AM 05/21/2017  ?  2:50 PM 10/10/2016  ?  4:40 PM  ?CMP  ?Glucose 65 - 99 mg/dL 96   119   98    ?BUN 8 - 27 mg/dL '19   20   16    '$ ?Creatinine 0.76 - 1.27 mg/dL 1.04   1.10   1.10    ?Sodium 134 - 144 mmol/L 141   143   141    ?Potassium 3.5 - 5.2 mmol/L 4.3   4.7   4.9    ?Chloride 96 - 106 mmol/L 105   107   106    ?CO2 20 - 29 mmol/L '23   28   31    '$ ?Calcium 8.6 - 10.2 mg/dL 9.4   9.9   10.2    ?Total Protein 6.0 - 8.5 g/dL 6.4   7.1   7.6    ?Total Bilirubin 0.0 - 1.2 mg/dL 0.4   0.2   0.5    ?Alkaline Phos 39 - 117 IU/L 71   48   53    ?AST 0 - 40 IU/L '30   26   26    '$ ?ALT 0 - 44 IU/L '18   14   16    '$ ? ? ?  Latest Ref Rng & Units 07/11/2020  ?  4:10 PM 06/21/2019  ?  9:32 AM 05/21/2017  ?  2:50 PM  ?CBC  ?WBC 4.0 - 10.5 K/uL 5.5   5.2   6.8    ?Hemoglobin 13.0 - 17.0 g/dL 14.0   14.7   15.3    ?Hematocrit 39.0 - 52.0 % 40.2   41.5   44.2    ?Platelets 150.0 - 400.0 K/uL 216.0   203   219.0    ? ?Lipid Panel  ?   ?Component Value Date/Time  ? CHOL 172 06/21/2019 0932  ? TRIG 82 06/21/2019 0932  ? HDL 62 06/21/2019 0932  ? [place] 95 06/21/2019 0932  ? ?HEMOGLOBIN A1C ?No results found for: HGBA1C, MPG ?TSH ?Recent Labs  ?  07/11/20 ?1610  ?TSH 1.50  ? ?  Medications and allergies  ? ?Allergies  ?Allergen Reactions  ? Carrot [Daucus Carota] Anaphylaxis  ?  raw  ? Lactose Intolerance (Gi) Diarrhea  ?  Bloating,gas  ?  ? ?Current Outpatient Medications:  ?  aspirin 81 MG tablet, Take 81 mg by mouth daily., Disp: , Rfl:  ?  cetirizine (ZYRTEC) 10 MG tablet, Take 10 mg by  mouth daily., Disp: , Rfl:  ?  EPINEPHrine 0.3 mg/0.3 mL IJ SOAJ injection, Inject 0.3 mLs into the muscle as needed (allergic reaction). , Disp: , Rfl: 1 ?  fluticasone (FLONASE) 50 MCG/ACT nasal spray, SHAKE LQ AND U 1 TO 2 SPRAYS IEN QD, Disp: , Rfl: 6 ?  ibuprofen (ADVIL) 200 MG tablet, Take 200 mg by mouth every 6 (six) hours as needed. Taking PRN for leg pain, Disp: , Rfl:  ?  ketorolac (ACULAR) 0.5 % ophthalmic solution, Place 1 drop into the left eye 4 (four) times daily., Disp: , Rfl:  ?  Multiple Vitamin (MULTIVITAMIN) tablet, Take 1 tablet by mouth daily., Disp: , Rfl:  ?  ofloxacin (OCUFLOX) 0.3 % ophthalmic solution, Place 1 drop into the left eye 4 (four) times daily., Disp: , Rfl:  ?  pravastatin (PRAVACHOL) 20 MG tablet, TAKE 1 TABLET BY MOUTH EVERY EVENING AFTER DINNER, Disp: 90 tablet, Rfl: 3 ?  prednisoLONE acetate (PRED FORTE) 1 % ophthalmic suspension, Place 1 drop into the left eye 4 (four) times daily., Disp: , Rfl:  ?  valsartan-hydrochlorothiazide (DIOVAN-HCT) 160-12.5 MG tablet, TAKE 1 TABLET BY MOUTH EVERY MORNING, Disp: 90 tablet, Rfl: 3 ?  verapamil (VERELAN PM) 120 MG 24 hr capsule, TAKE 1 CAPSULE BY MOUTH EVERY MORNING, Disp: 90 capsule, Rfl: 3 ?  vitamin C (ASCORBIC ACID) 250 MG tablet, Take 500 mg by mouth 2 (two) times daily. 1 in the morning and 1 in the evening, Disp: , Rfl:   ?  ?Radiology:  ? ?No results found. ? ?Cardiac Studies:  ? ?Exercise sestamibi stress test 02/23/2018:  ?1. The patient performed treadmill exercise using Bruce protocol, completing 6:30 minutes. The patient completed an estimated workload of 6.5 METS, reaching 109% of the maximum predicted heart rate. Resting hypertension 164/90 mmHg, with exaggerated exercise response seen with peak BP reaching 210/72 mmHg. Exercise capacity was low normal. No stress symptoms reported.  ?2. The overall quality of the study is good. Left ventricular cavity is noted to be enlarged on the rest and stress studies. Gated SPECT  images reveal global decrease in myocardial thickening and wall motion. The left ventricular ejection fraction was calculated or visually estimated to be 38%. Small sized, mild intensity, reversible perf

## 2021-07-05 NOTE — Patient Instructions (Signed)
Please keep leg elevated when resting. Using compression hose is highly recommended and should be worn within 1 hour of waking from bed. Avoid sitting for prolonged time. Recliner will not help. Try to lay your legs flat on a sofa or on a bed. Avoid excess salty food. Exercise regularly. ? ?

## 2021-07-07 ENCOUNTER — Encounter: Payer: Self-pay | Admitting: Cardiology

## 2021-07-19 ENCOUNTER — Other Ambulatory Visit: Payer: Self-pay | Admitting: Podiatry

## 2021-07-19 ENCOUNTER — Encounter: Payer: Self-pay | Admitting: Podiatry

## 2021-07-19 DIAGNOSIS — M2041 Other hammer toe(s) (acquired), right foot: Secondary | ICD-10-CM | POA: Diagnosis not present

## 2021-07-19 DIAGNOSIS — M19071 Primary osteoarthritis, right ankle and foot: Secondary | ICD-10-CM | POA: Diagnosis not present

## 2021-07-19 MED ORDER — OXYCODONE-ACETAMINOPHEN 5-325 MG PO TABS: 1.0000 | ORAL_TABLET | ORAL | 0 refills | Status: DC | PRN

## 2021-07-19 NOTE — Progress Notes (Signed)
PRN psotop 

## 2021-07-25 ENCOUNTER — Ambulatory Visit (INDEPENDENT_AMBULATORY_CARE_PROVIDER_SITE_OTHER): Payer: Federal, State, Local not specified - PPO | Admitting: Podiatry

## 2021-07-25 ENCOUNTER — Ambulatory Visit (INDEPENDENT_AMBULATORY_CARE_PROVIDER_SITE_OTHER): Payer: Federal, State, Local not specified - PPO

## 2021-07-25 DIAGNOSIS — Z09 Encounter for follow-up examination after completed treatment for conditions other than malignant neoplasm: Secondary | ICD-10-CM

## 2021-07-25 DIAGNOSIS — M2041 Other hammer toe(s) (acquired), right foot: Secondary | ICD-10-CM

## 2021-07-25 MED ORDER — DOXYCYCLINE HYCLATE 100 MG PO TABS: 100.0000 mg | ORAL_TABLET | Freq: Two times a day (BID) | ORAL | 0 refills | Status: AC

## 2021-07-25 MED ORDER — OXYCODONE-ACETAMINOPHEN 5-325 MG PO TABS: 1.0000 | ORAL_TABLET | Freq: Four times a day (QID) | ORAL | 0 refills | Status: AC | PRN

## 2021-07-25 NOTE — Progress Notes (Addendum)
   Subjective:  Patient presents today status post second metatarsal head resection and hammertoe repair right foot. DOS: 07/19/2021.  Patient states that he is doing well.  He did have an incident yesterday when he reactively stepped on a bug.  Patient states that he noted significant pain and tenderness at that time.  Otherwise he has been doing well and minimally ambulating with the surgical shoe  Past Medical History:  Diagnosis Date   Alcohol abuse    Sober since 01/08/71   Anxiety    Arthritis    Asthma    slight   Atrial tachycardia ([place]) 05/20/2018   Atrophy of muscle of both hands 08/05/2017   DDD (degenerative disc disease), cervical 08/05/2017   Depression    Enlarged prostate    GERD (gastroesophageal reflux disease)    History of asthma 08/05/2017   History of gastroesophageal reflux (GERD) 08/05/2017   HTN (hypertension)    Hyperlipidemia    Multiple allergies    PAC (premature atrial contraction) 05/20/2018   Primary osteoarthritis of both hands 08/05/2017   Recurrent infections 08/05/2017   RMS[facility] spotted fever) 11/08/2016   Shortness of breath dyspnea        Objective/Physical Exam Neurovascular status intact.  Skin incisions appear to be well coapted with staples intact.  No dehiscence. No active bleeding noted.  Localized edema noted to the surgical toe.  There is also some erythema also noted localized around the toe and surgical incision site.  Please see above noted photo  Radiographic Exam:  Percutaneous wire and osteotomies sites appear to be stable with routine healing.  Assessment: 1. s/p second metatarsal head resection with hammertoe repair second digit right. DOS: 07/19/2021   Plan of Care:  1. Patient was evaluated. X-rays reviewed 2.  Dressings changed.  Clean dry and intact x1 week 3.  Continue minimal weightbearing in the surgical shoe 4.  Prescription for doxycycline 100 mg 2 times daily #20 5.  Return to clinic 1  week   Edrick Kins, DPM [facility]  Dr. Edrick Kins, DPM    [address], [place] [address]                Office [phone]  Fax [phone]

## 2021-08-01 ENCOUNTER — Ambulatory Visit (INDEPENDENT_AMBULATORY_CARE_PROVIDER_SITE_OTHER): Payer: Federal, State, Local not specified - PPO | Admitting: Podiatry

## 2021-08-01 DIAGNOSIS — Z9889 Other specified postprocedural states: Secondary | ICD-10-CM

## 2021-08-06 NOTE — Progress Notes (Signed)
   Subjective:  Patient presents today status post second metatarsal head resection and hammertoe repair right foot. DOS: 07/19/2021.  Patient doing well.  Denies nausea vomiting fever chills or chest pain.  He says the antibiotics are helping significantly.  He is feeling much better.  He presents for further treatment and evaluation  Past Medical History:  Diagnosis Date   Alcohol abuse    Sober since 01/08/71   Anxiety    Arthritis    Asthma    slight   Atrial tachycardia ([place]) 05/20/2018   Atrophy of muscle of both hands 08/05/2017   DDD (degenerative disc disease), cervical 08/05/2017   Depression    Enlarged prostate    GERD (gastroesophageal reflux disease)    History of asthma 08/05/2017   History of gastroesophageal reflux (GERD) 08/05/2017   HTN (hypertension)    Hyperlipidemia    Multiple allergies    PAC (premature atrial contraction) 05/20/2018   Primary osteoarthritis of both hands 08/05/2017   Recurrent infections 08/05/2017   RMS[facility] spotted fever) 11/08/2016   Shortness of breath dyspnea    Objective/Physical Exam Neurovascular status intact.  Skin incisions appear to be well coapted with staples intact. No sign of infectious process noted. No dehiscence. No active bleeding noted. Moderate edema noted to the surgical toe.  The erythema localized around the toe appears significantly improved.  Radiographic Exam RT foot 07/25/2021:  Percutaneous wire and osteotomies sites appear to be stable with routine healing.  Assessment: 1. s/p second metatarsal head resection with hammertoe repair second digit right. DOS: 07/19/2021   Plan of Care:  1. Patient was evaluated. X-rays reviewed 2.  Continue the doxycycline 100 mg 2 times daily #20 that was prescribed last visit 3.  Sutures removed 4.  Continue weightbearing in the postsurgical shoe 5.  Return to clinic 2 weeks for possible pin removal   Edrick Kins, DPM [facility]  Dr.  Edrick Kins, DPM    [address], [place] [address]                Office [phone]  Fax [phone]

## 2021-08-15 ENCOUNTER — Ambulatory Visit (INDEPENDENT_AMBULATORY_CARE_PROVIDER_SITE_OTHER): Payer: Federal, State, Local not specified - PPO

## 2021-08-15 ENCOUNTER — Ambulatory Visit (INDEPENDENT_AMBULATORY_CARE_PROVIDER_SITE_OTHER): Payer: Federal, State, Local not specified - PPO | Admitting: Podiatry

## 2021-08-15 DIAGNOSIS — Z9889 Other specified postprocedural states: Secondary | ICD-10-CM | POA: Diagnosis not present

## 2021-08-15 MED ORDER — GABAPENTIN 100 MG PO CAPS: 100.0000 mg | ORAL_CAPSULE | Freq: Three times a day (TID) | ORAL | 3 refills | Status: DC

## 2021-08-15 NOTE — Progress Notes (Signed)
   Subjective:  Patient presents today status post second metatarsal head resection and hammertoe repair right foot. DOS: 07/19/2021.  Patient states that he is doing very well.  He has been weightbearing in the surgical shoe with minimal pain.  Overall he is doing well.  Patient does state that for several years now he is experienced numbness with tingling and burning sensations to the bilateral feet.  He believes he has peripheral neuropathy.  He is requesting information and to discuss different treatment options for the neuropathy  Past Medical History:  Diagnosis Date   Alcohol abuse    Sober since 01/08/71   Anxiety    Arthritis    Asthma    slight   Atrial tachycardia ([place]) 05/20/2018   Atrophy of muscle of both hands 08/05/2017   DDD (degenerative disc disease), cervical 08/05/2017   Depression    Enlarged prostate    GERD (gastroesophageal reflux disease)    History of asthma 08/05/2017   History of gastroesophageal reflux (GERD) 08/05/2017   HTN (hypertension)    Hyperlipidemia    Multiple allergies    PAC (premature atrial contraction) 05/20/2018   Primary osteoarthritis of both hands 08/05/2017   Recurrent infections 08/05/2017   RMSF [facility] spotted fever) 11/08/2016   Shortness of breath dyspnea    Objective/Physical Exam Neurovascular status intact.  Skin incisions healed.  Clinically no evidence of infection.  Percutaneous fixation pin intact.  Minimal edema noted to the toe.  Significantly improved.  Well-healing surgical foot  Radiographic Exam RT foot 07/25/2021:  Percutaneous wire and osteotomies sites appear to be stable with routine healing.  Assessment: 1. s/p second metatarsal head resection with hammertoe repair second digit right. DOS: 07/19/2021 2.  Idiopathic peripheral neuropathy   Plan of Care:  1. Patient was evaluated. X-rays reviewed 2.  Patient finished the oral doxycycline 100 mg 2 times daily 3.  Percutaneous fixation pin removed  today 4.  Patient may begin to transition out of the postsurgical shoe into good supportive sneakers 5.  Prescription for gabapentin 100 mg 3 times daily to help with the patient's lower extremity neuropathy symptoms  6.  Return to clinic 4 weeks  Edrick Kins, DPM [facility]  Dr. Edrick Kins, DPM    [address], [place] [address]                Office [phone]  Fax [phone]

## 2021-08-20 ENCOUNTER — Ambulatory Visit (INDEPENDENT_AMBULATORY_CARE_PROVIDER_SITE_OTHER): Payer: Federal, State, Local not specified - PPO | Admitting: Podiatry

## 2021-08-20 DIAGNOSIS — Z9889 Other specified postprocedural states: Secondary | ICD-10-CM

## 2021-08-20 NOTE — Progress Notes (Signed)
   Subjective:  Patient presents today status post second metatarsal head resection and hammertoe repair right foot. DOS: 07/19/2021.  Patient believes that there is a remaining skin staple into the central portion of the incision site.  He noticed it when showering.  He presents for further treatment and evaluation  Past Medical History:  Diagnosis Date   Alcohol abuse    Sober since 01/08/71   Anxiety    Arthritis    Asthma    slight   Atrial tachycardia ([place]) 05/20/2018   Atrophy of muscle of both hands 08/05/2017   DDD (degenerative disc disease), cervical 08/05/2017   Depression    Enlarged prostate    GERD (gastroesophageal reflux disease)    History of asthma 08/05/2017   History of gastroesophageal reflux (GERD) 08/05/2017   HTN (hypertension)    Hyperlipidemia    Multiple allergies    PAC (premature atrial contraction) 05/20/2018   Primary osteoarthritis of both hands 08/05/2017   Recurrent infections 08/05/2017   R[facility] spotted fever) 11/08/2016   Shortness of breath dyspnea    Objective/Physical Exam Neurovascular status intact.  Skin incisions healed.  Clinically no evidence of infection.  There is one solitary skin staple noted along the central portion of the incision site.  Minimal edema noted to the toe.  Well-healing surgical foot  Radiographic Exam RT foot 07/25/2021:  Percutaneous wire and osteotomies sites appear to be stable with routine healing.  Assessment: 1. s/p second metatarsal head resection with hammertoe repair second digit right. DOS: 07/19/2021 2.  Idiopathic peripheral neuropathy   Plan of Care:  1. Patient was evaluated.  2.  The remaining stainless steel skin staple to the central portion of the incision site that was buried under the skin was removed today. 3.  Continue gabapentin 100 mg 3 times daily to help alleviate the patient's lower extremity neuropathy symptoms  4.  Return to clinic next scheduled appointment for  follow-up x-rays  Edrick Kins, DPM [facility]  Dr. Edrick Kins, DPM    [address], [place] [address]                Office [phone]  Fax [phone]

## 2021-08-22 ENCOUNTER — Encounter: Payer: Federal, State, Local not specified - PPO | Admitting: Podiatry

## 2021-09-26 ENCOUNTER — Ambulatory Visit (INDEPENDENT_AMBULATORY_CARE_PROVIDER_SITE_OTHER): Payer: Federal, State, Local not specified - PPO

## 2021-09-26 ENCOUNTER — Ambulatory Visit (INDEPENDENT_AMBULATORY_CARE_PROVIDER_SITE_OTHER): Payer: Federal, State, Local not specified - PPO | Admitting: Podiatry

## 2021-09-26 DIAGNOSIS — M19071 Primary osteoarthritis, right ankle and foot: Secondary | ICD-10-CM | POA: Diagnosis not present

## 2021-09-26 DIAGNOSIS — Z9889 Other specified postprocedural states: Secondary | ICD-10-CM

## 2021-09-26 MED ORDER — MELOXICAM 15 MG PO TABS: 15.0000 mg | ORAL_TABLET | Freq: Every day | ORAL | 1 refills | Status: DC

## 2021-09-26 NOTE — Addendum Note (Signed)
Addended by: Edrick Kins on: 09/26/2021 11:53 AM   Modules accepted: Orders

## 2021-09-26 NOTE — Progress Notes (Addendum)
   Chief Complaint  Patient presents with   Foot Problem    2ND METATARSAL HEAD RESECTION RT, HAMMETOE 2ND ARTHROPLASTY RT, SWELLING, X2 WEEKS AGO   Subjective:  Patient presents today status post second metatarsal head resection and hammertoe repair right foot. DOS: 07/19/2021.  Patient is doing well.  He says that he was mowing with a push lawnmower for about 1 hour about 1 week ago when he developed some tenderness after activity.  Overall doing well however.  Past Medical History:  Diagnosis Date   Alcohol abuse    Sober since 01/08/71   Anxiety    Arthritis    Asthma    slight   Atrial tachycardia ([place]) 05/20/2018   Atrophy of muscle of both hands 08/05/2017   DDD (degenerative disc disease), cervical 08/05/2017   Depression    Enlarged prostate    GERD (gastroesophageal reflux disease)    History of asthma 08/05/2017   History of gastroesophageal reflux (GERD) 08/05/2017   HTN (hypertension)    Hyperlipidemia    Multiple allergies    PAC (premature atrial contraction) 05/20/2018   Primary osteoarthritis of both hands 08/05/2017   Recurrent infections 08/05/2017   RMS[facility] spotted fever) 11/08/2016   Shortness of breath dyspnea    Objective/Physical Exam Neurovascular status intact.  Skin incisions healed.  Minimal edema noted to the surgical area.  Well-healing surgical foot.  Radiographic Exam RT foot 09/26/2021:  Well-healing osteotomy site of the second metatarsal head resection.  No additional findings noted.  Percutaneous pin and stainless steel skin staples were all removed.  Assessment: 1. s/p second metatarsal head resection with hammertoe repair second digit right. DOS: 07/19/2021 2.  Idiopathic peripheral neuropathy   Plan of Care:  1. Patient was evaluated.  X-rays reviewed 2.  Continue gabapentin 100 mg 3 times daily to help alleviate the patient's lower extremity neuropathy symptoms  4.  Continue wearing good supportive shoes and sneakers   5.  Patient may now increase to full activity no restrictions  6.  Prescription for meloxicam 15 mg daily  7.  Return to clinic as needed  Edrick Kins, DPM [facility]  Dr. Edrick Kins, DPM    [address], [place] [address]                Office [phone]  Fax [phone]

## 2021-12-09 ENCOUNTER — Other Ambulatory Visit: Payer: Self-pay | Admitting: Cardiology

## 2021-12-18 ENCOUNTER — Telehealth: Payer: Self-pay | Admitting: Cardiology

## 2021-12-18 NOTE — Telephone Encounter (Signed)
Patient states pharmacy will not refill medication until he has an appointment. Scheduled for 12/26/21. Patient needs refill on two of his medications (do not fill the 'statin' per patient).

## 2021-12-19 ENCOUNTER — Other Ambulatory Visit: Payer: Self-pay

## 2021-12-19 MED ORDER — VERAPAMIL HCL ER 120 MG PO CP24: 120.0000 mg | ORAL_CAPSULE | Freq: Every morning | ORAL | 0 refills | Status: DC

## 2021-12-19 MED ORDER — VALSARTAN-HYDROCHLOROTHIAZIDE 160-12.5 MG PO TABS: 1.0000 | ORAL_TABLET | ORAL | 0 refills | Status: DC

## 2021-12-19 NOTE — Telephone Encounter (Signed)
Medication have been refilled and sent

## 2021-12-26 ENCOUNTER — Ambulatory Visit: Payer: Federal, State, Local not specified - PPO | Admitting: Cardiology

## 2021-12-26 ENCOUNTER — Encounter: Payer: Self-pay | Admitting: Cardiology

## 2021-12-26 VITALS — BP 147/87 | HR 87 | Temp 98.1°F | Resp 16 | Ht 70.0 in | Wt 166.2 lb

## 2021-12-26 DIAGNOSIS — R600 Localized edema: Secondary | ICD-10-CM

## 2021-12-26 DIAGNOSIS — I10 Essential (primary) hypertension: Secondary | ICD-10-CM

## 2021-12-26 DIAGNOSIS — E785 Hyperlipidemia, unspecified: Secondary | ICD-10-CM

## 2021-12-26 MED ORDER — PRAVASTATIN SODIUM 20 MG PO TABS: ORAL_TABLET | ORAL | 3 refills | Status: AC

## 2021-12-26 MED ORDER — VALSARTAN-HYDROCHLOROTHIAZIDE 320-25 MG PO TABS: 1.0000 | ORAL_TABLET | Freq: Every day | ORAL | 3 refills | Status: DC

## 2021-12-26 NOTE — Progress Notes (Signed)
Primary Physician/Referring:  Ann Held, DO  Patient ID: Ernest Sanders, male    DOB: October 31, 1945, 76 y.o.   MRN: [id number]  Chief Complaint  Patient presents with   Medication Management   Follow-up   Hypertension   PCV   HPI:    Ernest Sanders  is a 76 y.o.  Caucasian male with hypertension, asymptomatic sinus bradycardia, frequent PVCs and atrial bigeminy on the EKG, mild hyperlipidemia, bilateral chronic lower extremity edema due to varicose veins and wears support stockings, abnormal nuclear stress test in December 2019, in the form of reduced LVEF and a very small inferoapical defect suggestive of ischemia although echocardiogram was normal.  Patient presents here for follow-up of hypertension and hyperlipidemia.  He is asymptomatic, states that his working on his house and his yard to getting it fixed so he can sell it.  Past Medical History:  Diagnosis Date   Alcohol abuse    Sober since 01/08/71   Anxiety    Arthritis    Asthma    slight   Atrial tachycardia 05/20/2018   Atrophy of muscle of both hands 08/05/2017   DDD (degenerative disc disease), cervical 08/05/2017   Depression    Enlarged prostate    GERD (gastroesophageal reflux disease)    History of asthma 08/05/2017   History of gastroesophageal reflux (GERD) 08/05/2017   HTN (hypertension)    Hyperlipidemia    Multiple allergies    PAC (premature atrial contraction) 05/20/2018   Primary osteoarthritis of both hands 08/05/2017   Recurrent infections 08/05/2017   [facility] spotted fever) 11/08/2016   Shortness of breath dyspnea    Past Surgical History:  Procedure Laterality Date   EYE SURGERY  06/22/2021   INGUINAL HERNIA REPAIR Bilateral 11/21/2015   Procedure: OPEN BILATERAL INGUINAL HERNIA REPAIR;  Surgeon: [name], MD;  Location: [place];  Service: General;  Laterality: Bilateral;   INSERTION OF MESH Bilateral 11/21/2015   Procedure: INSERTION OF MESH;  Surgeon:  [name], MD;  Location: [place];  Service: General;  Laterality: Bilateral;   LIPOMA EXCISION N/A 11/21/2015   Procedure: EXCISION TWO ABDOMINAL WALL LIPOMAS;  Surgeon: [name], MD;  Location: [facility];  Service: General;  Laterality: N/A;   TOOTH ECTRACTION Right    UMBILICAL HERNIA REPAIR  2002   Family History  Problem Relation Age of Onset   Heart disease Father        CHF   Hypertension Mother    Diabetes Mother        Surgical   Arthritis Other    Stroke Paternal Grandmother     Social History   Tobacco Use   Smoking status: Former    Types: Pipe, Cigars    Quit date: 1984    Years since quitting: 39.8   Smokeless tobacco: Never  Substance Use Topics   Alcohol use: No    Comment: Recovering, November 1972   ROS  Review of Systems  Cardiovascular:  Negative for chest pain, dyspnea on exertion and leg swelling.  Gastrointestinal:  Negative for melena.   Objective  Blood pressure (!) 147/87, pulse 87, temperature 98.1 F (36.7 C), temperature source Temporal, resp. rate 16, height _0  (1.778 m), weight 166 lb 3.2 oz (75.4 kg), SpO2 96 %.     12/26/2021   11:47 AM 12/26/2021   11:36 AM 07/05/2021    1:24 PM  Vitals with BMI  Height  _1  _2   Weight  166 lbs 3 oz 158 lbs  BMI  67.20 94.70  Systolic 962 836 629  Diastolic 87 83 67  Pulse 87 69 67     Physical Exam Neck:     Vascular: No carotid bruit or JVD.  Cardiovascular:     Rate and Rhythm: Normal rate and regular rhythm.     Pulses: Intact distal pulses.     Heart sounds: Normal heart sounds. No murmur heard.    No gallop.  Pulmonary:     Effort: Pulmonary effort is normal.     Breath sounds: Normal breath sounds.  Abdominal:     General: Bowel sounds are normal.     Palpations: Abdomen is soft.  Musculoskeletal:     Right lower leg: Edema (2+ below knee and superficial varicose veins noted) present.     Left lower leg: Edema (1-2+ below knee pitting edema) present.    Laboratory  examination:   Lab Results  Component Value Date   NA 141 06/21/2019   K 4.3 06/21/2019   CO2 23 06/21/2019   GLUCOSE 96 06/21/2019   BUN 19 06/21/2019   CREATININE 1.04 06/21/2019   CALCIUM 9.4 06/21/2019   GFRNONAA 71 06/21/2019        Latest Ref Rng & Units 06/21/2019    9:32 AM 05/21/2017    2:50 PM 10/10/2016    4:40 PM  CMP  Glucose 65 - 99 mg/dL 96  119  98   BUN 8 - 27 mg/dL _0 Creatinine 0.76 - 1.27 mg/dL 1.04  1.10  1.10   Sodium 134 - 144 mmol/L 141  143  141   Potassium 3.5 - 5.2 mmol/L 4.3  4.7  4.9   Chloride 96 - 106 mmol/L 105  107  106   CO2 20 - 29 mmol/L _1 Calcium 8.6 - 10.2 mg/dL 9.4  9.9  10.2   Total Protein 6.0 - 8.5 g/dL 6.4  7.1  7.6   Total Bilirubin 0.0 - 1.2 mg/dL 0.4  0.2  0.5   Alkaline Phos 39 - 117 IU/L 71  48  53   AST 0 - 40 IU/L _2 ALT 0 - 44 IU/L _3 Latest Ref Rng & Units 07/11/2020    4:10 PM 06/21/2019    9:32 AM 05/21/2017    2:50 PM  CBC  WBC 4.0 - 10.5 K/uL 5.5  5.2  6.8   Hemoglobin 13.0 - 17.0 g/dL 14.0  14.7  15.3   Hematocrit 39.0 - 52.0 % 40.2  41.5  44.2   Platelets 150.0 - 400.0 K/uL 216.0  203  219.0    Lipid Panel     Component Value Date/Time   CHOL 172 06/21/2019 0932   TRIG 82 06/21/2019 0932   HDL 62 06/21/2019 0932   LDLCALC 95 06/21/2019 0932   HEMOGLOBIN A1C No results found for: "HGBA1C", "MPG" TSH No results for input(s): "TSH" in the last 8760 hours.  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 , Disp: , Rfl: 1   fluticasone (FLONASE) 50 MCG/ACT nasal spray, SHAKE LQ AND U 1 TO 2 SPRAYS IEN QD, Disp: , Rfl: 6   gabapentin (NEURONTIN) 100 MG capsule, Take 1 capsule (100 mg total) by mouth 3 (three) times daily., Disp: 90 capsule, Rfl: 3   ibuprofen (ADVIL) 200 MG tablet, Take 200 mg by mouth every 6 (six) hours as needed. Taking PRN for leg pain, Disp: , Rfl:    Multiple Vitamin (MULTIVITAMIN) tablet, Take 1 tablet by mouth daily., Disp: , Rfl:    oxyCODONE-acetaminophen (PERCOCET) 5-325 MG tablet, Take 1 tablet by mouth every 6 (six) hours as needed for severe pain., Disp: 20 tablet, Rfl: 0   valsartan-hydrochlorothiazide (DIOVAN-HCT) 320-25 MG tablet, Take 1 tablet by mouth daily., Disp: 90 tablet, Rfl: 3   verapamil (VERELAN PM) 120 MG 24 hr capsule, Take 1 capsule (120 mg total) by mouth every morning., Disp: 90 capsule, Rfl: 0   vitamin C (ASCORBIC ACID) 250 MG tablet, Take 500 mg by mouth 2 (two) times daily. 1 in the morning and 1 in the evening, Disp: , Rfl:    pravastatin (PRAVACHOL) 20 MG tablet, TAKE 1 TABLET BY MOUTH EVERY EVENING AFTER DINNER, Disp: 90 tablet, Rfl: 3    Radiology:   No results found.  Cardiac Studies:   Exercise sestamibi stress test 02/23/2018:  1. The patient performed treadmill exercise using Bruce protocol, completing 6:30 minutes. The patient completed an estimated workload of 6.5 METS, reaching 109% of the maximum predicted heart rate. Resting hypertension 164/90 mmHg, with exaggerated exercise response seen with peak BP reaching 210/72 mmHg. Exercise capacity was low normal. No stress symptoms reported.  2. The overall quality of the study is good. Left ventricular cavity is noted to be enlarged on the rest and stress studies. Gated SPECT images reveal global decrease in myocardial thickening and wall motion. The left ventricular ejection fraction was calculated or visually estimated to be 38%.  Small sized, mild intensity, reversible perfusion defect in apical inferolateral myocardium.  3. High risk study due to reduced LVEF and regional perfusion defect.   Echocardiogram 03/03/2018:  Left ventricle cavity is normal in size. Mild  hypertrophy of the left ventricle. Normal global wall motion. Frequent premature beats are seen throughout the test. Calculated EF 64%.  2. Left atrial cavity is mildly dilated. 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  3. Mild (Grade I) aortic regurgitation. 4. Mild (Grade I) mitral regurgitation. 5. Mild tricuspid regurgitation. No evidence of pulmonary hypertension.  EKG  EKG 07/05/2021: Normal sinus rhythm at rate of 66 bpm, incomplete right bundle branch block.  Normal EKG. no significant change from 07/05/2020.   Assessment     ICD-10-CM   1. Primary hypertension  I10 valsartan-hydrochlorothiazide (DIOVAN-HCT) 320-25 MG tablet    CMP14+EGFR    2. Mild hyperlipidemia  E78.5 pravastatin (PRAVACHOL) 20 MG tablet    Lipid Panel With LDL/HDL Ratio    3. Bilateral leg edema  R60.0 valsartan-hydrochlorothiazide (DIOVAN-HCT) 320-25 MG tablet       Meds ordered this encounter  Medications   pravastatin (PRAVACHOL) 20 MG tablet    Sig: TAKE 1 TABLET BY MOUTH EVERY EVENING AFTER DINNER  Dispense:  90 tablet    Refill:  3   valsartan-hydrochlorothiazide (DIOVAN-HCT) 320-25 MG tablet    Sig: Take 1 tablet by mouth daily.    Dispense:  90 tablet    Refill:  3    Medications Discontinued During This Encounter  Medication Reason   cetirizine (ZYRTEC) 10 MG tablet    ofloxacin (OCUFLOX) 0.3 % ophthalmic solution    ketorolac (ACULAR) 0.5 % ophthalmic solution    pravastatin (PRAVACHOL) 20 MG tablet Patient Preference   prednisoLONE acetate (PRED FORTE) 1 % ophthalmic suspension    meloxicam (MOBIC) 15 MG tablet No longer needed (for PRN medications)   valsartan-hydrochlorothiazide (DIOVAN-HCT) 160-12.5 MG  tablet Dose change    Recommendations:   Ernest Sanders  is a 76 y.o. Caucasian male with hypertension, asymptomatic sinus bradycardia, frequent PVCs and atrial bigeminy on the EKG, mild hyperlipidemia, bilateral chronic lower extremity edema due to varicose veins and wears support stockings, abnormal nuclear stress test in December 2019, in the form of reduced LVEF and a very small inferoapical defect suggestive of ischemia although echocardiogram was normal.  Patient presents here for follow-up of hypertension and hyperlipidemia.  He has discontinued low-dose pravastatin as he felt his cholesterol had improved.  Otherwise has been doing well and remains asymptomatic.  His blood pressure was elevated, patient also states that his blood pressure has been around 140-150 range.  I have doubled his valsartan HCT from 160/12.5 mg to 320/25 mg which may help with both leg edema and hypertension.  With regard to mild abnormal nuclear stress test, he may have underlying coronary artery disease mildly elevated LDL previously, restart pravastatin at 20 mg as patient is very skeptical of starting any other high intensity statins or higher dose.  With regard to leg edema, he has been compliant with using support stockings.  Unless he becomes symptomatic we will continue conservative therapy.  We will obtain lipid panel and also CMP in 4 weeks and see him back in 6 weeks and if he remains stable on annual basis.    Adrian Prows, MD, [facility] 12/26/2021, 12:29 PM Office: [phone] Pager: [phone]

## 2022-02-06 ENCOUNTER — Ambulatory Visit: Payer: Federal, State, Local not specified - PPO | Admitting: Cardiology

## 2022-03-12 ENCOUNTER — Other Ambulatory Visit: Payer: Self-pay | Admitting: Cardiology

## 2022-03-18 ENCOUNTER — Ambulatory Visit: Payer: Federal, State, Local not specified - PPO | Admitting: Cardiology

## 2022-03-21 ENCOUNTER — Other Ambulatory Visit: Payer: Self-pay | Admitting: Cardiology

## 2022-03-21 IMAGING — US US SOFT TISSUE HEAD/NECK
1 series · 14 of 16 positions shown · non-contrast
Comparison: None.

CLINICAL DATA: Initial evaluation for possible enlarged lymph node
at right neck.

EXAM:
ULTRASOUND OF HEAD/NECK SOFT TISSUES
TECHNIQUE: Ultrasound examination of the head and neck soft tissues was
performed in the area of clinical concern.

[Series 1: us soft tissue head/neck · 16 acquisitions, 14 frames shown]
[im 1/16]
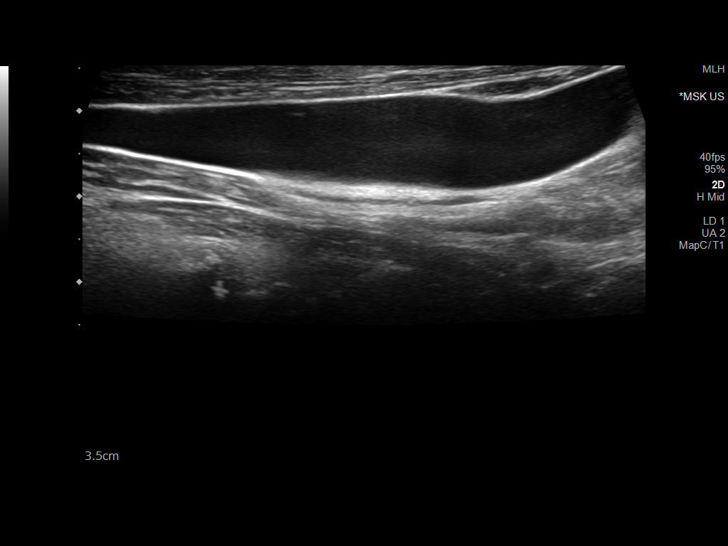
[im 2/16]
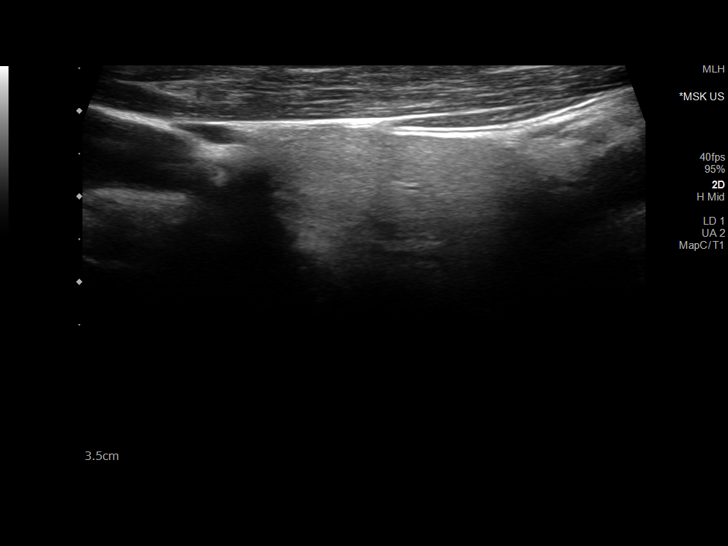
[im 3/16]
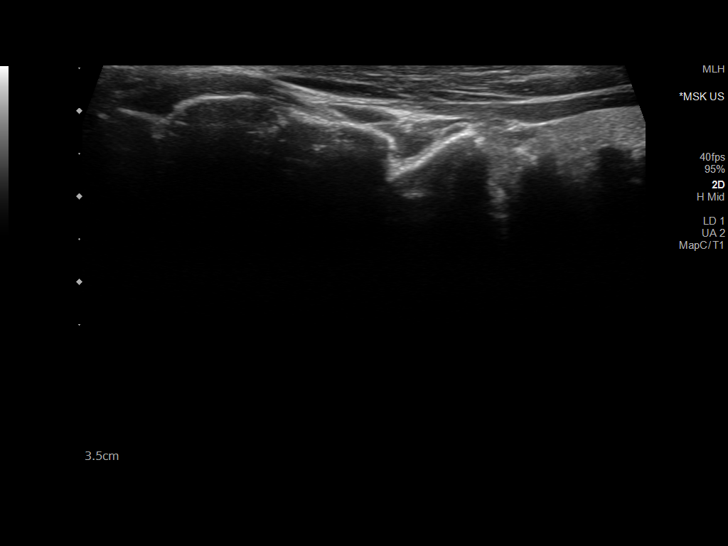
[im 5/16]
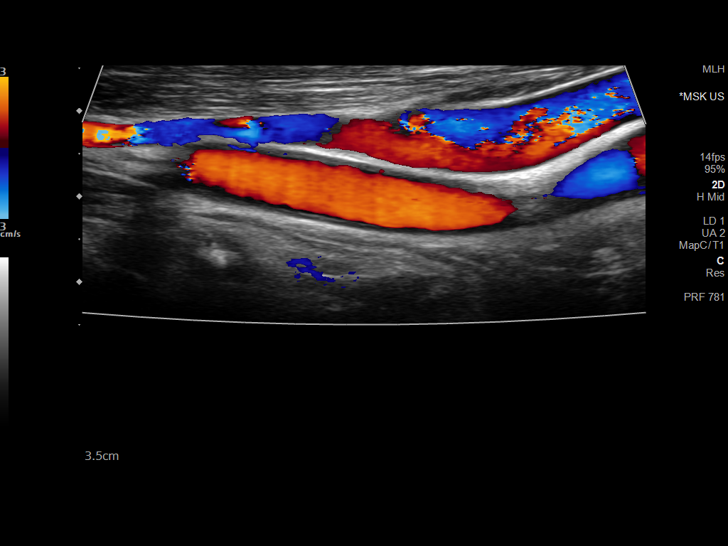
[im 6/16]
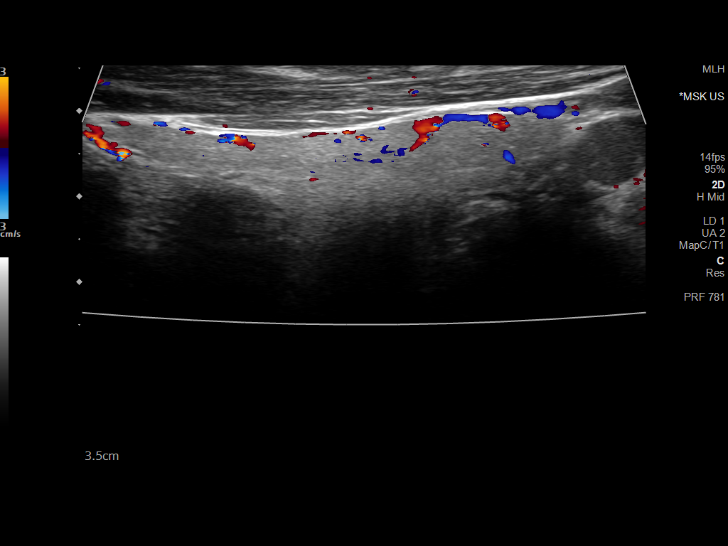
[im 7/16]
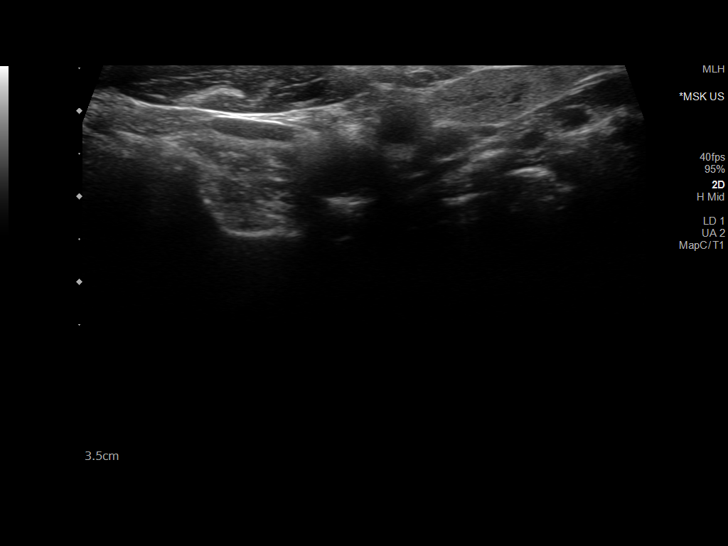
[im 8/16]
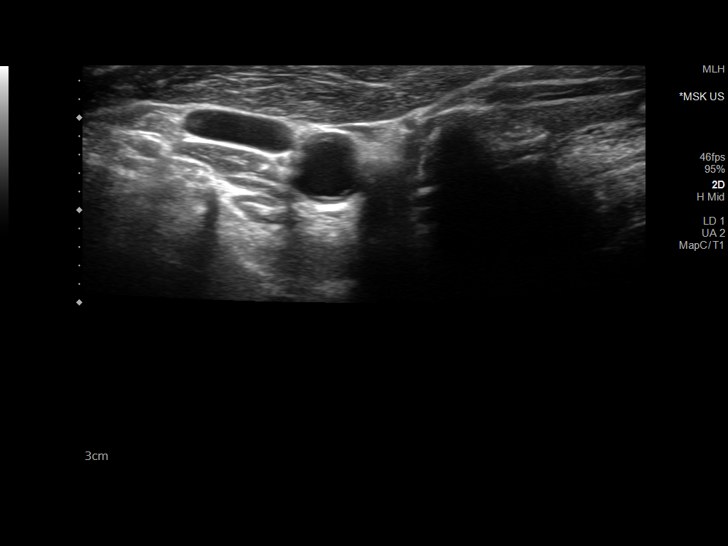
[im 9/16]
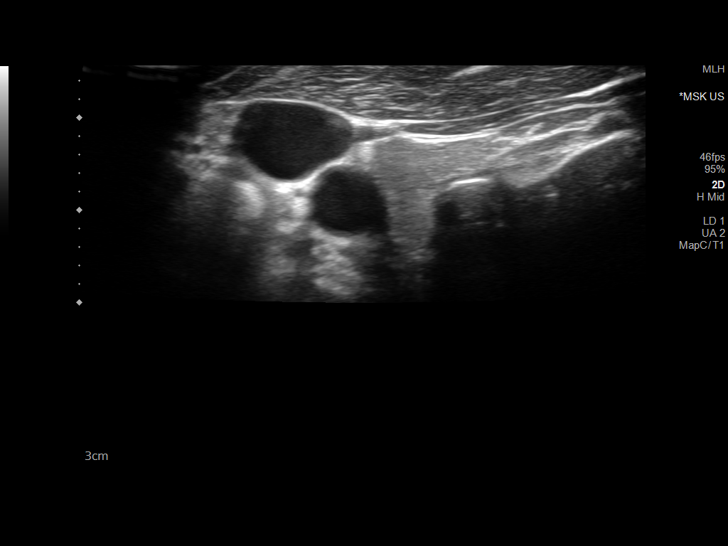
[im 10/16]
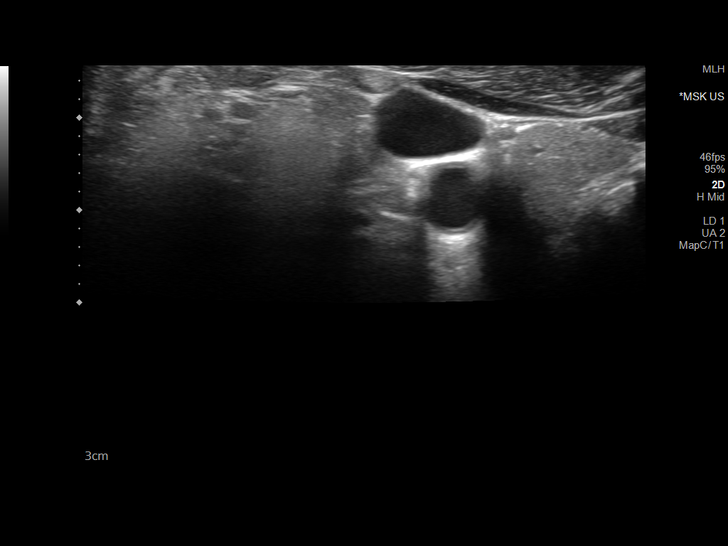
[im 11/16]
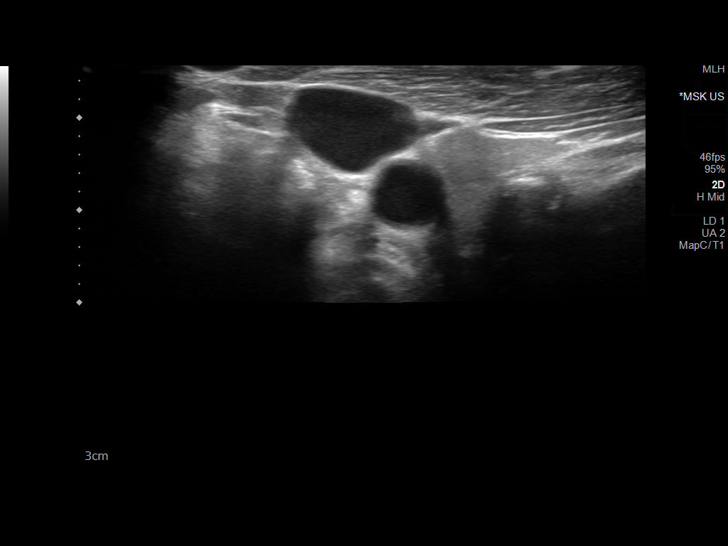
[im 13/16]
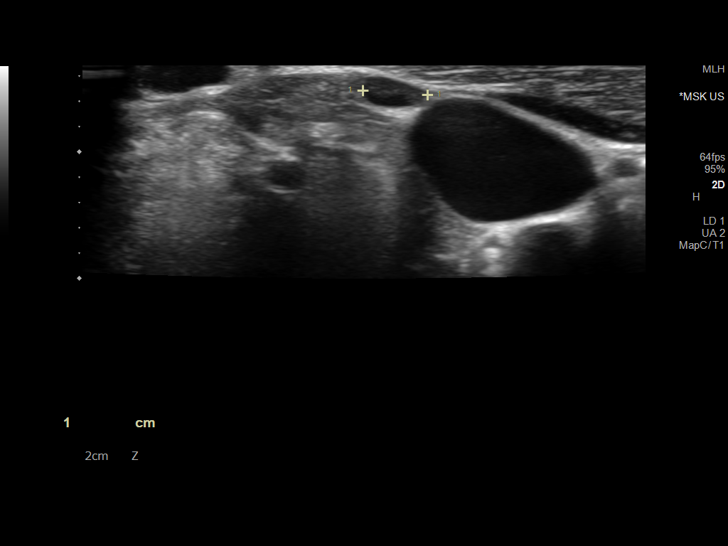
[im 14/16]
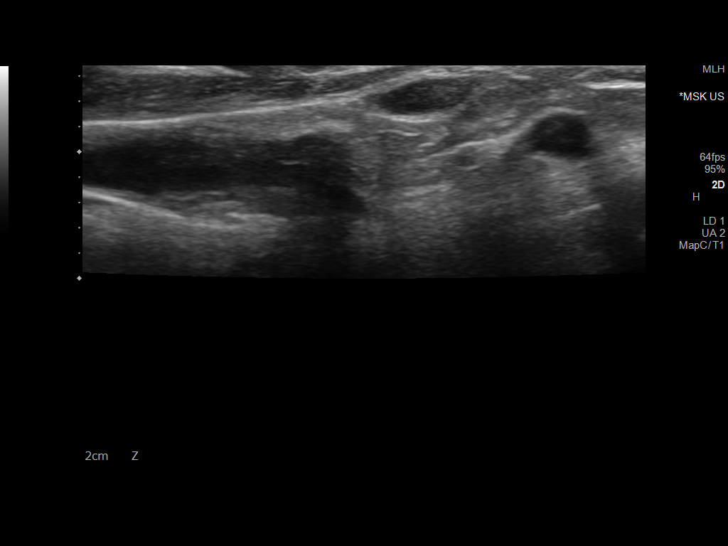
[im 15/16]
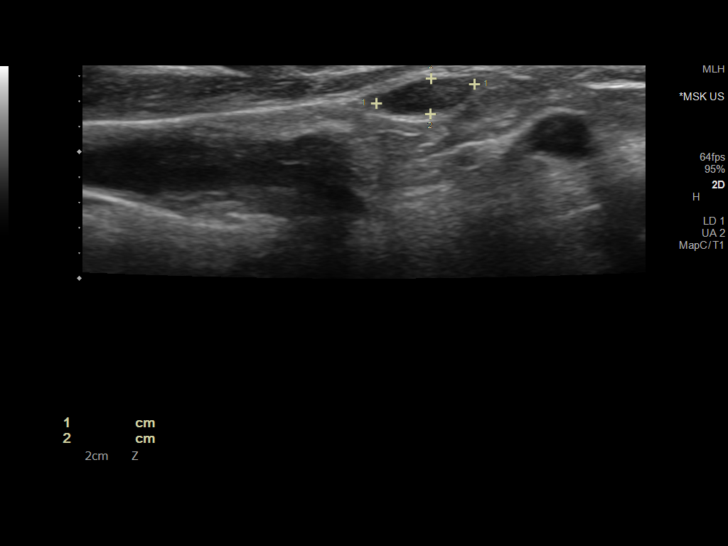
[im 16/16]
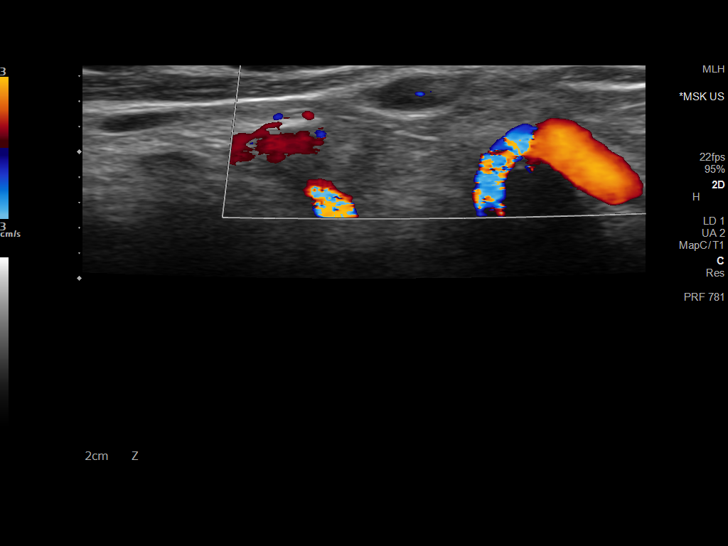

[14 of 16 positions shown; findings below may reference images not displayed]

FINDINGS: Targeted ultrasound of an area of concern at the right neck was
performed. Ultrasound demonstrates a small normal appearing lymph
node measuring 3 mm in short axis. No concerning features by
sonography. No other enlarged or pathologic adenopathy seen within
the right neck. No other soft tissue mass or collection.
IMPRESSION: Small normal appearing lymph node measuring 3 mm in short axis at
the area of concern at the right neck. No other enlarged or
pathologic adenopathy identified.

If symptoms should worsen or the clinical scenario should warrant,
further assessment with dedicated contrast enhanced CT of the neck
could be performed as warranted.

## 2022-06-19 ENCOUNTER — Other Ambulatory Visit: Payer: Self-pay | Admitting: Cardiology

## 2022-07-19 ENCOUNTER — Other Ambulatory Visit: Payer: Self-pay | Admitting: Cardiology

## 2022-08-18 ENCOUNTER — Other Ambulatory Visit: Payer: Self-pay | Admitting: Cardiology

## 2022-09-02 ENCOUNTER — Other Ambulatory Visit: Payer: Self-pay | Admitting: Podiatry

## 2022-09-03 ENCOUNTER — Telehealth: Payer: Self-pay | Admitting: Podiatry

## 2022-09-03 NOTE — Telephone Encounter (Signed)
Pt calling asking for a refill for Rx Gabapentin.    Please advise

## 2022-09-04 ENCOUNTER — Other Ambulatory Visit: Payer: Self-pay | Admitting: Podiatry

## 2022-09-04 MED ORDER — GABAPENTIN 100 MG PO CAPS: 100.0000 mg | ORAL_CAPSULE | Freq: Three times a day (TID) | ORAL | 3 refills | Status: AC

## 2022-12-19 ENCOUNTER — Other Ambulatory Visit: Payer: Self-pay | Admitting: Cardiology

## 2022-12-19 DIAGNOSIS — R600 Localized edema: Secondary | ICD-10-CM

## 2022-12-19 DIAGNOSIS — I10 Essential (primary) hypertension: Secondary | ICD-10-CM

## 2023-01-18 ENCOUNTER — Other Ambulatory Visit: Payer: Self-pay | Admitting: Cardiology

## 2023-01-18 DIAGNOSIS — I10 Essential (primary) hypertension: Secondary | ICD-10-CM

## 2023-01-18 DIAGNOSIS — R600 Localized edema: Secondary | ICD-10-CM

## 2023-02-16 ENCOUNTER — Other Ambulatory Visit: Payer: Self-pay | Admitting: Podiatry

## 2023-02-25 ENCOUNTER — Telehealth: Payer: Self-pay | Admitting: Family Medicine

## 2023-02-25 NOTE — Telephone Encounter (Signed)
Lvm to schedule yearly exam.

## 2023-03-18 ENCOUNTER — Other Ambulatory Visit: Payer: Self-pay | Admitting: Cardiology

## 2023-03-18 DIAGNOSIS — I10 Essential (primary) hypertension: Secondary | ICD-10-CM

## 2023-03-18 DIAGNOSIS — R600 Localized edema: Secondary | ICD-10-CM

## 2023-05-20 ENCOUNTER — Other Ambulatory Visit: Payer: Self-pay | Admitting: Cardiology

## 2023-06-16 ENCOUNTER — Other Ambulatory Visit: Payer: Self-pay | Admitting: Cardiology

## 2023-06-19 ENCOUNTER — Other Ambulatory Visit: Payer: Self-pay

## 2023-06-19 MED ORDER — VERAPAMIL HCL ER 120 MG PO CP24: 120.0000 mg | ORAL_CAPSULE | Freq: Every morning | ORAL | 0 refills | Status: AC

## 2023-08-20 ENCOUNTER — Other Ambulatory Visit: Payer: Self-pay | Admitting: Cardiology
